# Patient Record
Sex: Female | Born: 1945
Health system: Southern US, Community
[De-identification: ages and names within clinical notes are randomized; demographics above are authoritative.]

## PROBLEM LIST (undated history)

## (undated) DIAGNOSIS — F419 Anxiety disorder, unspecified: Secondary | ICD-10-CM

## (undated) DIAGNOSIS — C801 Malignant (primary) neoplasm, unspecified: Secondary | ICD-10-CM

## (undated) DIAGNOSIS — T8859XA Other complications of anesthesia, initial encounter: Secondary | ICD-10-CM

## (undated) DIAGNOSIS — T4145XA Adverse effect of unspecified anesthetic, initial encounter: Secondary | ICD-10-CM

## (undated) DIAGNOSIS — O24419 Gestational diabetes mellitus in pregnancy, unspecified control: Secondary | ICD-10-CM

## (undated) HISTORY — DX: Anxiety disorder, unspecified: F41.9

## (undated) HISTORY — PX: TUBAL LIGATION: SHX77

## (undated) HISTORY — PX: DILATION AND CURETTAGE OF UTERUS: SHX78

---
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
## 2009-10-28 ENCOUNTER — Ambulatory Visit: Payer: Self-pay | Admitting: Internal Medicine

## 2009-11-09 ENCOUNTER — Ambulatory Visit: Payer: Self-pay

## 2009-11-10 ENCOUNTER — Ambulatory Visit: Payer: Self-pay | Admitting: Otolaryngology

## 2009-11-17 ENCOUNTER — Ambulatory Visit: Payer: Self-pay | Admitting: Cardiovascular Disease

## 2010-12-20 LAB — HM PAP SMEAR: HM Pap smear: NORMAL

## 2013-01-22 LAB — CBC AND DIFFERENTIAL: HEMOGLOBIN: 14.1 g/dL (ref 12.0–16.0)

## 2013-01-22 LAB — BASIC METABOLIC PANEL
BUN: 6 mg/dL (ref 4–21)
Creatinine: 0.7 mg/dL (ref 0.5–1.1)

## 2013-01-22 LAB — LIPID PANEL
Cholesterol: 185 mg/dL (ref 0–200)
HDL: 63 mg/dL (ref 35–70)
LDL CALC: 105 mg/dL
TRIGLYCERIDES: 86 mg/dL (ref 40–160)

## 2013-01-22 LAB — TSH: TSH: 1.6 u[IU]/mL (ref 0.41–5.90)

## 2013-01-24 ENCOUNTER — Ambulatory Visit: Payer: Self-pay | Admitting: Internal Medicine

## 2013-01-24 LAB — HM MAMMOGRAPHY: HM Mammogram: NORMAL

## 2014-12-15 DIAGNOSIS — Z85828 Personal history of other malignant neoplasm of skin: Secondary | ICD-10-CM | POA: Insufficient documentation

## 2015-03-10 ENCOUNTER — Encounter: Payer: Self-pay | Admitting: Internal Medicine

## 2015-03-10 DIAGNOSIS — E559 Vitamin D deficiency, unspecified: Secondary | ICD-10-CM | POA: Insufficient documentation

## 2015-03-10 DIAGNOSIS — N951 Menopausal and female climacteric states: Secondary | ICD-10-CM | POA: Insufficient documentation

## 2015-03-10 DIAGNOSIS — K589 Irritable bowel syndrome without diarrhea: Secondary | ICD-10-CM | POA: Insufficient documentation

## 2015-04-06 ENCOUNTER — Encounter: Payer: Self-pay | Admitting: Internal Medicine

## 2015-04-06 ENCOUNTER — Ambulatory Visit (INDEPENDENT_AMBULATORY_CARE_PROVIDER_SITE_OTHER): Payer: Medicare Other | Admitting: Internal Medicine

## 2015-04-06 VITALS — BP 112/70 | HR 68 | Ht 73.5 in | Wt 143.0 lb

## 2015-04-06 DIAGNOSIS — Z0000 Encounter for general adult medical examination without abnormal findings: Secondary | ICD-10-CM | POA: Diagnosis not present

## 2015-04-06 DIAGNOSIS — E2839 Other primary ovarian failure: Secondary | ICD-10-CM | POA: Diagnosis not present

## 2015-04-06 DIAGNOSIS — Z1159 Encounter for screening for other viral diseases: Secondary | ICD-10-CM | POA: Diagnosis not present

## 2015-04-06 DIAGNOSIS — E559 Vitamin D deficiency, unspecified: Secondary | ICD-10-CM

## 2015-04-06 DIAGNOSIS — K589 Irritable bowel syndrome without diarrhea: Secondary | ICD-10-CM

## 2015-04-06 DIAGNOSIS — Z1239 Encounter for other screening for malignant neoplasm of breast: Secondary | ICD-10-CM

## 2015-04-06 NOTE — Patient Instructions (Addendum)
Health Maintenance  Topic Date Due  . Hepatitis C Screening  Ordered today  . TETANUS/TDAP  03/07/1965  . COLONOSCOPY  Under consideration  . ZOSTAVAX  03/07/2006  . DEXA SCAN  Planned  . PNA vac Low Risk Adult (1 of 2 - PCV13) 03/08/2011  . MAMMOGRAM  Planned  . INFLUENZA VACCINE  10/19/2015 (Originally 10/19/2014)   Breast Self-Awareness Practicing breast self-awareness may pick up problems early, prevent significant medical complications, and possibly save your life. By practicing breast self-awareness, you can become familiar with how your breasts look and feel and if your breasts are changing. This allows you to notice changes early. It can also offer you some reassurance that your breast health is good. One way to learn what is normal for your breasts and whether your breasts are changing is to do a breast self-exam. If you find a lump or something that was not present in the past, it is best to contact your caregiver right away. Other findings that should be evaluated by your caregiver include nipple discharge, especially if it is bloody; skin changes or reddening; areas where the skin seems to be pulled in (retracted); or new lumps and bumps. Breast pain is seldom associated with cancer (malignancy), but should also be evaluated by a caregiver. HOW TO PERFORM A BREAST SELF-EXAM The best time to examine your breasts is 5-7 days after your menstrual period is over. During menstruation, the breasts are lumpier, and it may be more difficult to pick up changes. If you do not menstruate, have reached menopause, or had your uterus removed (hysterectomy), you should examine your breasts at regular intervals, such as monthly. If you are breastfeeding, examine your breasts after a feeding or after using a breast pump. Breast implants do not decrease the risk for lumps or tumors, so continue to perform breast self-exams as recommended. Talk to your caregiver about how to determine the difference between  the implant and breast tissue. Also, talk about the amount of pressure you should use during the exam. Over time, you will become more familiar with the variations of your breasts and more comfortable with the exam. A breast self-exam requires you to remove all your clothes above the waist. 1. Look at your breasts and nipples. Stand in front of a mirror in a room with good lighting. With your hands on your hips, push your hands firmly downward. Look for a difference in shape, contour, and size from one breast to the other (asymmetry). Asymmetry includes puckers, dips, or bumps. Also, look for skin changes, such as reddened or scaly areas on the breasts. Look for nipple changes, such as discharge, dimpling, repositioning, or redness. 2. Carefully feel your breasts. This is best done either in the shower or tub while using soapy water or when flat on your back. Place the arm (on the side of the breast you are examining) above your head. Use the pads (not the fingertips) of your three middle fingers on your opposite hand to feel your breasts. Start in the underarm area and use  inch (2 cm) overlapping circles to feel your breast. Use 3 different levels of pressure (light, medium, and firm pressure) at each circle before moving to the next circle. The light pressure is needed to feel the tissue closest to the skin. The medium pressure will help to feel breast tissue a little deeper, while the firm pressure is needed to feel the tissue close to the ribs. Continue the overlapping circles, moving downward over  the breast until you feel your ribs below your breast. Then, move one finger-width towards the center of the body. Continue to use the  inch (2 cm) overlapping circles to feel your breast as you move slowly up toward the collar bone (clavicle) near the base of the neck. Continue the up and down exam using all 3 pressures until you reach the middle of the chest. Do this with each breast, carefully feeling for  lumps or changes. 3.  Keep a written record with breast changes or normal findings for each breast. By writing this information down, you do not need to depend only on memory for size, tenderness, or location. Write down where you are in your menstrual cycle, if you are still menstruating. Breast tissue can have some lumps or thick tissue. However, see your caregiver if you find anything that concerns you.  SEEK MEDICAL CARE IF:  You see a change in shape, contour, or size of your breasts or nipples.   You see skin changes, such as reddened or scaly areas on the breasts or nipples.   You have an unusual discharge from your nipples.   You feel a new lump or unusually thick areas.    This information is not intended to replace advice given to you by your health care provider. Make sure you discuss any questions you have with your health care provider.   Document Released: 03/06/2005 Document Revised: 02/21/2012 Document Reviewed: 06/21/2011 Elsevier Interactive Patient Education [employer].

## 2015-04-06 NOTE — Progress Notes (Signed)
Patient: Jessica Monroe, Female    DOB: April 27, 1945, 70 y.o.   MRN: [id number] Visit Date: 04/06/2015  Today's Provider: Halina Maidens, MD   Chief Complaint  Patient presents with  . Medicare Wellness   Subjective:    Annual wellness visit Jessica Monroe is a 70 y.o. female who presents today for her Subsequent Annual Wellness Visit. She feels well. She reports exercising walking 2 hours daily. She reports she is sleeping well. 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  She has had several falls - none recent - and would consider osteoporosis treatment if needed.  Her last mammogram was several years ago - she denies breast problems. She has never had a colonoscopy - would consider it if stool were positive for occult blood.  ----------------------------------------------------------- HPI  Review of Systems  Constitutional: Negative for fever, chills, diaphoresis and fatigue.  HENT: Negative for hearing loss, sinus pressure, tinnitus, trouble swallowing and voice change.   Eyes: Negative for visual disturbance.  Respiratory: Negative for cough, chest tightness and shortness of breath.   Cardiovascular: Negative for chest pain, palpitations and leg swelling.  Gastrointestinal: Negative for abdominal pain and blood in stool.  Genitourinary: Negative for dysuria, hematuria, vaginal bleeding and vaginal discharge.  Musculoskeletal: Negative for back pain and arthralgias.  Skin: Negative for color change.  Allergic/Immunologic: Negative for food allergies.  Neurological: Negative for dizziness, syncope, numbness and headaches.  Hematological: Negative for adenopathy. Does not bruise/bleed easily.  Psychiatric/Behavioral: Negative for sleep disturbance and dysphoric mood.    Social History   Social History  . Marital Status: Single    Spouse Name: N/A  . Number of Children: N/A  . Years of Education:  N/A   Occupational History  . Not on file.   Social History Main Topics  . Smoking status: Former Research scientist (life sciences)  . Smokeless tobacco: Not on file  . Alcohol Use: No  . Drug Use: No  . Sexual Activity: Not on file   Other Topics Concern  . Not on file   Social History Narrative    Patient Active Problem List   Diagnosis Date Noted  . IBS (irritable bowel syndrome) 03/10/2015  . Vitamin D deficiency 03/10/2015  . Menopause syndrome 03/10/2015  . H/O neoplasm 12/15/2014    Past Surgical History  Procedure Laterality Date  . Tubal ligation    . Dilation and curettage of uterus      Her family history includes Bipolar disorder in her father; Ovarian cancer in her mother.    Previous Medications   DOCOSAHEXAENOIC ACID (DHA) 200 MG CAPS    Take by mouth.   MULTIPLE VITAMIN (MULTI-VITAMINS) TABS    Take 1 tablet by mouth daily.    Patient Care Team: Glean Hess, MD as PCP - General (Family Medicine)     Objective:   Vitals: BP 112/70 mmHg  Pulse 68  Ht 6' 1.5" (1.867 m)  Wt 143 lb (64.864 kg)  BMI 18.61 kg/m2  Physical Exam  Constitutional: She is oriented to person, place, and time. She appears well-developed and well-nourished. No distress.  HENT:  Head: Normocephalic and atraumatic.  Right Ear: Tympanic membrane and ear canal normal.  Left Ear: Tympanic membrane and ear canal normal.  Nose: Right sinus exhibits no maxillary sinus tenderness. Left sinus exhibits no maxillary sinus tenderness.  Mouth/Throat: Uvula is midline and oropharynx is clear and moist.  Eyes: Conjunctivae  and EOM are normal. Right eye exhibits no discharge. Left eye exhibits no discharge. No scleral icterus.  Neck: Normal range of motion. Carotid bruit is not present. No erythema present. No thyromegaly present.  Cardiovascular: Normal rate, regular rhythm, normal heart sounds and normal pulses.   Pulmonary/Chest: Effort normal. No respiratory distress. She has no wheezes. Right breast  exhibits no mass, no nipple discharge, no skin change and no tenderness. Left breast exhibits no mass, no nipple discharge, no skin change and no tenderness.  Abdominal: Soft. Bowel sounds are normal. There is no hepatosplenomegaly. There is no tenderness. There is no CVA tenderness.  Genitourinary: Uterus normal. There is no rash or tenderness on the right labia. There is no rash or tenderness on the left labia. Cervix exhibits no motion tenderness. Right adnexum displays no mass, no tenderness and no fullness. Left adnexum displays no mass, no tenderness and no fullness.  Musculoskeletal: Normal range of motion.  Lymphadenopathy:    She has no cervical adenopathy.    She has no axillary adenopathy.  Neurological: She is alert and oriented to person, place, and time. She has normal reflexes. No cranial nerve deficit or sensory deficit.  Skin: Skin is warm, dry and intact. No rash noted.  Psychiatric: She has a normal mood and affect. Her speech is normal and behavior is normal. Thought content normal.  Nursing note and vitals reviewed.   Activities of Daily Living In your present state of health, do you have any difficulty performing the following activities: 04/06/2015  Hearing? N  Vision? N  Difficulty concentrating or making decisions? N  Walking or climbing stairs? N  Dressing or bathing? N  Doing errands, shopping? N    Fall Risk Assessment Fall Risk  04/06/2015  Falls in the past year? No      Depression Screen PHQ 2/9 Scores 04/06/2015  PHQ - 2 Score 0    Cognitive Testing - 6-CIT   Correct? Score   What year is it? yes 0 Yes = 0    No = 4  What month is it? yes 0 Yes = 0    No = 3  Remember:     [address]ia [address], [place], [place]     What time is it? yes 0 Yes = 0    No = 3  Count backwards from 20 to 1 yes 0 Correct = 0    1 error = 2   More than 1 error = 4  Say the months of the year in reverse. yes 0 Correct = 0    1 error = 2   More than 1 error = 4  What  address did I ask you to remember? yes 0 Correct = 0  1 error = 2    2 error = 4    3 error = 6    4 error = 8    All wrong = 10       TOTAL SCORE  0/28   Interpretation:  Normal  Normal (0-7) Abnormal (8-28)        Medicare Annual Wellness Visit Summary:  Reviewed patient's Family Medical History Reviewed and updated list of patient's medical providers Assessment of cognitive impairment was done Assessed patient's functional ability Established a written schedule for health screening [place] Completed and Reviewed  Exercise Activities and Dietary recommendations Goals    None       There is no immunization history on file for  this patient.  Health Maintenance  Topic Date Due  . Hepatitis C Screening  1945-04-28  . TETANUS/TDAP  03/07/1965  . COLONOSCOPY  03/07/1996  . ZOSTAVAX  03/07/2006  . DEXA SCAN  03/08/2011  . PNA vac Low Risk Adult (1 of 2 - PCV13) 03/08/2011  . MAMMOGRAM  01/25/2015  . INFLUENZA VACCINE  10/19/2015 (Originally 10/19/2014)     Discussed health benefits of physical activity, and encouraged her to engage in regular exercise appropriate for her age and condition.    ------------------------------------------------------------------------------------------------------------   Assessment & Plan:   1. Medicare annual wellness visit, subsequent Patient declines all vaccinations - POCT urinalysis dipstick  2. IBS (irritable bowel syndrome) Stable symptoms managed well with vegetarian diet and high-fiber - CBC with Differential/Platelet - Comprehensive metabolic panel - TSH - Fecal occult blood, imunochemical  3. Vitamin D deficiency Recommend calcium plus vitamin D 2 tablets daily  4. Need for hepatitis C screening test - Hepatitis C antibody  5. Breast cancer screening - MM DIGITAL SCREENING BILATERAL; Future  6. Ovarian failure - DG Bone Density; Future   Halina Maidens, MD [facility]  04/06/2015

## 2015-04-07 LAB — COMPREHENSIVE METABOLIC PANEL
A/G RATIO: 2 (ref 1.1–2.5)
ALBUMIN: 4.5 g/dL (ref 3.6–4.8)
ALT: 10 IU/L (ref 0–32)
AST: 44 IU/L — ABNORMAL HIGH (ref 0–40)
Alkaline Phosphatase: 70 IU/L (ref 39–117)
BILIRUBIN TOTAL: 0.3 mg/dL (ref 0.0–1.2)
BUN / CREAT RATIO: 11 (ref 11–26)
BUN: 9 mg/dL (ref 8–27)
CALCIUM: 9.7 mg/dL (ref 8.7–10.3)
CHLORIDE: 99 mmol/L (ref 96–106)
CO2: 28 mmol/L (ref 18–29)
Creatinine, Ser: 0.83 mg/dL (ref 0.57–1.00)
GFR, EST AFRICAN AMERICAN: 83 mL/min/{1.73_m2} (ref >59)
GFR, EST NON AFRICAN AMERICAN: 72 mL/min/{1.73_m2} (ref >59)
GLOBULIN, TOTAL: 2.3 g/dL (ref 1.5–4.5)
Glucose: 95 mg/dL (ref 65–99)
POTASSIUM: 4.7 mmol/L (ref 3.5–5.2)
SODIUM: 142 mmol/L (ref 134–144)
TOTAL PROTEIN: 6.8 g/dL (ref 6.0–8.5)

## 2015-04-07 LAB — HEPATITIS C ANTIBODY: Hep C Virus Ab: <0.1 s/co ratio (ref 0.0–0.9)

## 2015-04-07 LAB — CBC WITH DIFFERENTIAL/PLATELET
BASOS ABS: 0 10*3/uL (ref 0.0–0.2)
Basos: 1 %
EOS (ABSOLUTE): 0.1 10*3/uL (ref 0.0–0.4)
Eos: 2 %
HEMOGLOBIN: 13.9 g/dL (ref 11.1–15.9)
Hematocrit: 42.2 % (ref 34.0–46.6)
IMMATURE GRANS (ABS): 0 10*3/uL (ref 0.0–0.1)
IMMATURE GRANULOCYTES: 0 %
Lymphocytes Absolute: 1.4 10*3/uL (ref 0.7–3.1)
Lymphs: 26 %
MCH: 30.4 pg (ref 26.6–33.0)
MCHC: 32.9 g/dL (ref 31.5–35.7)
MCV: 92 fL (ref 79–97)
MONOCYTES: 8 %
Monocytes Absolute: 0.5 10*3/uL (ref 0.1–0.9)
Neutrophils Absolute: 3.4 10*3/uL (ref 1.4–7.0)
Neutrophils: 63 %
PLATELETS: 250 10*3/uL (ref 150–379)
RBC: 4.57 x10E6/uL (ref 3.77–5.28)
RDW: 13.7 % (ref 12.3–15.4)
WBC: 5.4 10*3/uL (ref 3.4–10.8)

## 2015-04-07 LAB — TSH: TSH: 2.88 u[IU]/mL (ref 0.450–4.500)

## 2015-04-08 ENCOUNTER — Telehealth: Payer: Self-pay

## 2015-04-08 NOTE — Telephone Encounter (Signed)
Spoke with patient. Patient advised of all results and verbalized understanding. Will call back with any future questions or concerns. MAH  

## 2015-04-08 NOTE — Telephone Encounter (Signed)
-----   Message from Glean Hess, MD sent at 04/07/2015  1:35 PM EST ----- Labs are all normal.

## 2015-04-13 ENCOUNTER — Telehealth: Payer: Self-pay

## 2015-04-13 ENCOUNTER — Ambulatory Visit
Admission: RE | Admit: 2015-04-13 | Discharge: 2015-04-13 | Disposition: A | Discharge status: Home / Self Care | Payer: Medicare Other | Source: Ambulatory Visit | Attending: Internal Medicine | Admitting: Internal Medicine

## 2015-04-13 ENCOUNTER — Encounter: Payer: Self-pay | Admitting: Internal Medicine

## 2015-04-13 DIAGNOSIS — E2839 Other primary ovarian failure: Secondary | ICD-10-CM | POA: Diagnosis present

## 2015-04-13 DIAGNOSIS — Z1239 Encounter for other screening for malignant neoplasm of breast: Secondary | ICD-10-CM | POA: Diagnosis present

## 2015-04-13 DIAGNOSIS — M810 Age-related osteoporosis without current pathological fracture: Secondary | ICD-10-CM | POA: Insufficient documentation

## 2015-04-13 DIAGNOSIS — Z1231 Encounter for screening mammogram for malignant neoplasm of breast: Secondary | ICD-10-CM | POA: Diagnosis not present

## 2015-04-13 HISTORY — DX: Malignant (primary) neoplasm, unspecified: C80.1

## 2015-04-13 NOTE — Telephone Encounter (Signed)
Spoke with pt. Pt. Advised of all results. Patient verbalized understanding.

## 2015-04-13 NOTE — Telephone Encounter (Signed)
-----   Message from Glean Hess, MD sent at 04/13/2015  1:04 PM EST ----- Bone density shows osteoporosis at the hips and significant bone loss (osteopenia) at the spine.  Make sure to take calcium 1600 mg and Vitamin D 800 IU per day.  Should also consider treatment with Fosamax 70 mg weekly and repeat test in 2 years.

## 2015-04-24 LAB — FECAL OCCULT BLOOD, IMMUNOCHEMICAL: FECAL OCCULT BLD: NEGATIVE

## 2015-04-26 ENCOUNTER — Telehealth: Payer: Self-pay

## 2015-04-26 NOTE — Telephone Encounter (Signed)
Spoke with patient. Patient advised of all results and verbalized understanding. Will call back with any future questions or concerns. MAH  

## 2015-04-26 NOTE — Telephone Encounter (Signed)
-----   Message from Glean Hess, MD sent at 04/24/2015  3:48 PM EST ----- Stool test is negative for blood.

## 2016-08-01 ENCOUNTER — Encounter: Payer: Self-pay | Admitting: Internal Medicine

## 2016-08-02 ENCOUNTER — Encounter: Payer: Self-pay | Admitting: Internal Medicine

## 2016-08-02 ENCOUNTER — Ambulatory Visit (INDEPENDENT_AMBULATORY_CARE_PROVIDER_SITE_OTHER): Payer: Medicare Other | Admitting: Internal Medicine

## 2016-08-02 VITALS — BP 128/78 | HR 80 | Ht 72.0 in | Wt 134.0 lb

## 2016-08-02 DIAGNOSIS — Z85828 Personal history of other malignant neoplasm of skin: Secondary | ICD-10-CM

## 2016-08-02 DIAGNOSIS — K589 Irritable bowel syndrome without diarrhea: Secondary | ICD-10-CM

## 2016-08-02 DIAGNOSIS — Z0000 Encounter for general adult medical examination without abnormal findings: Secondary | ICD-10-CM

## 2016-08-02 DIAGNOSIS — D485 Neoplasm of uncertain behavior of skin: Secondary | ICD-10-CM

## 2016-08-02 DIAGNOSIS — Z1211 Encounter for screening for malignant neoplasm of colon: Secondary | ICD-10-CM

## 2016-08-02 DIAGNOSIS — E559 Vitamin D deficiency, unspecified: Secondary | ICD-10-CM | POA: Diagnosis not present

## 2016-08-02 LAB — POCT URINALYSIS DIPSTICK
Bilirubin, UA: NEGATIVE
GLUCOSE UA: NEGATIVE
Ketones, UA: NEGATIVE
Leukocytes, UA: NEGATIVE
Nitrite, UA: NEGATIVE
PH UA: 6.5 (ref 5.0–8.0)
Protein, UA: NEGATIVE
RBC UA: NEGATIVE
SPEC GRAV UA: 1.015 (ref 1.010–1.025)
UROBILINOGEN UA: 0.2 U/dL

## 2016-08-02 NOTE — Patient Instructions (Signed)
Health Maintenance  Topic Date Due  . TETANUS/TDAP  03/07/1965  . COLONOSCOPY  03/07/1996  . PNA vac Low Risk Adult (1 of 2 - PCV13) 03/08/2011  . INFLUENZA VACCINE  10/18/2016  . MAMMOGRAM  04/13/2017  . DEXA SCAN  Completed  . Hepatitis C Screening  Completed    Breast Self-Awareness Breast self-awareness means being familiar with how your breasts look and feel. It involves checking your breasts regularly and reporting any changes to your health care provider. Practicing breast self-awareness is important. A change in your breasts can be a sign of a serious medical problem. Being familiar with how your breasts look and feel allows you to find any problems early, when treatment is more likely to be successful. All women should practice breast self-awareness, including women who have had breast implants. How to do a breast self-exam One way to learn what is normal for your breasts and whether your breasts are changing is to do a breast self-exam. To do a breast self-exam: Look for Changes   1. Remove all the clothing above your waist. 2. Stand in front of a mirror in a room with good lighting. 3. Put your hands on your hips. 4. Push your hands firmly downward. 5. Compare your breasts in the mirror. Look for differences between them (asymmetry), such as:  Differences in shape.  Differences in size.  Puckers, dips, and bumps in one breast and not the other. 6. Look at each breast for changes in your skin, such as:  Redness.  Scaly areas. 7. Look for changes in your nipples, such as:  Discharge.  Bleeding.  Dimpling.  Redness.  A change in position. Feel for Changes   Carefully feel your breasts for lumps and changes. It is best to do this while lying on your back on the floor and again while sitting or standing in the shower or tub with soapy water on your skin. Feel each breast in the following way:  Place the arm on the side of the breast you are examining above your  head.  Feel your breast with the other hand.  Start in the nipple area and make  inch (2 cm) overlapping circles to feel your breast. Use the pads of your three middle fingers to do this. Apply light pressure, then medium pressure, then firm pressure. The light pressure will allow you to feel the tissue closest to the skin. The medium pressure will allow you to feel the tissue that is a little deeper. The firm pressure will allow you to feel the tissue close to the ribs.  Continue the overlapping circles, moving downward over the breast until you feel your ribs below your breast.  Move one finger-width toward the center of the body. Continue to use the  inch (2 cm) overlapping circles to feel your breast as you move slowly up toward your collarbone.  Continue the up and down exam using all three pressures until you reach your armpit. Write Down What You Find   Write down what is normal for each breast and any changes that you find. Keep a written record with breast changes or normal findings for each breast. By writing this information down, you do not need to depend only on memory for size, tenderness, or location. Write down where you are in your menstrual cycle, if you are still menstruating. If you are having trouble noticing differences in your breasts, do not get discouraged. With time you will become more familiar with the  variations in your breasts and more comfortable with the exam. How often should I examine my breasts? Examine your breasts every month. If you are breastfeeding, the best time to examine your breasts is after a feeding or after using a breast pump. If you menstruate, the best time to examine your breasts is 5-7 days after your period is over. During your period, your breasts are lumpier, and it may be more difficult to notice changes. When should I see my health care provider? See your health care provider if you notice:  A change in shape or size of your breasts or  nipples.  A change in the skin of your breast or nipples, such as a reddened or scaly area.  Unusual discharge from your nipples.  A lump or thick area that was not there before.  Pain in your breasts.  Anything that concerns you. This information is not intended to replace advice given to you by your health care provider. Make sure you discuss any questions you have with your health care provider. Document Released: 03/06/2005 Document Revised: 08/12/2015 Document Reviewed: 01/24/2015 Elsevier Interactive Patient Education  2017 [employer].

## 2016-08-02 NOTE — Progress Notes (Signed)
Patient: Jessica Monroe, Female    DOB: 10-22-1945, 71 y.o.   MRN: [id number] Visit Date: 08/02/2016  Today's Provider: Halina Maidens, MD   Chief Complaint  Patient presents with  . Medicare Wellness    Pap and Breast.   Subjective:    Annual wellness visit Jessica Monroe is a 71 y.o. female who presents today for her Subsequent Annual Wellness Visit. She feels well. She reports exercising walking. She reports she is sleeping fairly well. She is now vegetarian and has lost 9 lbs.  ----------------------------------------------------------- HPI  Review of Systems  Constitutional: Positive for unexpected weight change (likely related to dental issues and vegetarian diet). Negative for chills, fatigue and fever.  HENT: Negative for congestion, hearing loss, tinnitus, trouble swallowing and voice change.   Eyes: Negative for visual disturbance.  Respiratory: Negative for cough, chest tightness, shortness of breath and wheezing.   Cardiovascular: Negative for chest pain, palpitations and leg swelling.  Gastrointestinal: Positive for abdominal distention (lump in lower left abdomen). Negative for abdominal pain, constipation, diarrhea and vomiting.  Endocrine: Negative for polydipsia and polyuria.  Genitourinary: Negative for dysuria, frequency, genital sores, vaginal bleeding and vaginal discharge.  Musculoskeletal: Positive for back pain. Negative for arthralgias, gait problem and joint swelling.  Skin: Positive for color change (mole on labia; wart on toe). Negative for rash.  Neurological: Negative for dizziness, tremors, light-headedness and headaches.  Hematological: Negative for adenopathy. Does not bruise/bleed easily.  Psychiatric/Behavioral: Negative for dysphoric mood and sleep disturbance. The patient is not nervous/anxious.     Social History   Social History  . Marital status: Single    Spouse name: N/A  . Number of children: N/A  . Years of education: N/A    Occupational History  . Not on file.   Social History Main Topics  . Smoking status: Former Research scientist (life sciences)  . Smokeless tobacco: Never Used  . Alcohol use No  . Drug use: No  . Sexual activity: Not on file   Other Topics Concern  . Not on file   Social History Narrative  . No narrative on file    Patient Active Problem List   Diagnosis Date Noted  . Osteoporosis 04/13/2015  . IBS (irritable bowel syndrome) 03/10/2015  . Vitamin D deficiency 03/10/2015  . Menopause syndrome 03/10/2015  . Hx of nonmelanoma skin cancer 12/15/2014    Past Surgical History:  Procedure Laterality Date  . DILATION AND CURETTAGE OF UTERUS    . TUBAL LIGATION      Her family history includes Bipolar disorder in her father; Ovarian cancer in her mother.     Previous Medications   CALCIUM-VITAMIN D (OSCAL WITH D) 500-200 MG-UNIT TABLET    Take 1 tablet by mouth.   DOCOSAHEXAENOIC ACID (DHA) 200 MG CAPS    Take by mouth.   MULTIPLE VITAMIN (MULTI-VITAMINS) TABS    Take 1 tablet by mouth daily.    Patient Care Team: Glean Hess, MD as PCP - General (Family Medicine)      Objective:   Vitals: BP 128/78 (BP Location: Right Arm, Patient Position: Sitting, Cuff Size: Normal)   Pulse 80   Ht 6' (1.829 m)   Wt 134 lb (60.8 kg)   SpO2 99%   BMI 18.17 kg/m   Physical Exam  Constitutional: She is oriented to person, place, and time. She appears well-developed and well-nourished. No distress.  HENT:  Head: Normocephalic and atraumatic.  Right Ear: Tympanic membrane and ear canal normal.  Left Ear: Tympanic membrane and ear canal normal.  Nose: Right sinus exhibits no maxillary sinus tenderness. Left sinus exhibits no maxillary sinus tenderness.  Mouth/Throat: Uvula is midline and oropharynx is clear and moist.  Eyes: Conjunctivae and EOM are normal. Right eye exhibits no discharge. Left eye exhibits no discharge. No scleral icterus.  Neck: Normal range of motion. Carotid bruit is not  present. No erythema present. No thyromegaly present.  Cardiovascular: Normal rate, regular rhythm, normal heart sounds and normal pulses.   Pulmonary/Chest: Effort normal. No respiratory distress. She has no wheezes. Right breast exhibits no mass, no nipple discharge, no skin change and no tenderness. Left breast exhibits no mass, no nipple discharge, no skin change and no tenderness.  Abdominal: Soft. Bowel sounds are normal. There is no hepatosplenomegaly. There is no tenderness. There is no CVA tenderness.    Genitourinary: Uterus normal.    There is no tenderness, lesion or injury on the right labia. There is lesion on the left labia. There is no tenderness or injury on the left labia. Cervix exhibits no motion tenderness. Right adnexum displays no mass, no tenderness and no fullness. Left adnexum displays no mass, no tenderness and no fullness. No vaginal discharge found.  Musculoskeletal: Normal range of motion.  Lymphadenopathy:    She has no cervical adenopathy.    She has no axillary adenopathy.  Neurological: She is alert and oriented to person, place, and time. She has normal reflexes. No cranial nerve deficit or sensory deficit.  Skin: Skin is warm, dry and intact. No rash noted.  Psychiatric: She has a normal mood and affect. Her speech is normal and behavior is normal. Thought content normal.  Nursing note and vitals reviewed.   Activities of Daily Living In your present state of health, do you have any difficulty performing the following activities: 08/02/2016  Hearing? N  Vision? N  Difficulty concentrating or making decisions? N  Walking or climbing stairs? N  Dressing or bathing? N  Doing errands, shopping? N  Preparing Food and eating ? N  Using the Toilet? N  In the past six months, have you accidently leaked urine? N  Do you have problems with loss of bowel control? N  Managing your Medications? N  Housekeeping or managing your Housekeeping? N  Some recent data  might be hidden   Fall Risk Assessment Fall Risk  08/02/2016 04/06/2015  Falls in the past year? No No    Depression Screen PHQ 2/9 Scores 08/02/2016 04/06/2015  PHQ - 2 Score 0 0   6CIT Screen 08/02/2016  What Year? 0 points  What month? 0 points  What time? 0 points  Count back from 20 0 points  Months in reverse 2 points  Repeat phrase 0 points  Total Score 2    Medicare Annual Wellness Visit Summary:  Reviewed patient's Family Medical History Reviewed and updated list of patient's medical providers Assessment of cognitive impairment was done Assessed patient's functional ability Established a written schedule for health screening [place] Completed and Reviewed  Exercise Activities and Dietary recommendations Goals    None       There is no immunization history on file for this patient.  Health Maintenance  Topic Date Due  . TETANUS/TDAP  03/07/1965  . COLONOSCOPY  03/07/1996  . PNA vac Low Risk Adult (1 of 2 - PCV13) 03/08/2011  . INFLUENZA VACCINE  10/18/2016  . MAMMOGRAM  04/13/2017  . DEXA SCAN  Completed  .  Hepatitis C Screening  Completed    Discussed health benefits of physical activity, and encouraged her to engage in regular exercise appropriate for her age and condition.    ------------------------------------------------------------------------------------------------------------  Assessment & Plan:  1. Medicare annual wellness visit, subsequent Measures satisfied She declines mammograms, vaccinations and colonoscopy Monitor weight for additional losses Increase intake of protein and healthy fats - Lipid panel - POCT urinalysis dipstick  2. Irritable bowel syndrome, unspecified type stable - CBC with Differential/Platelet - Comprehensive metabolic panel - TSH  3. Vitamin D deficiency Continue supplements  4. Hx of nonmelanoma skin cancer Follow up with Dermatology if labial lesion changes and for warty lesion on  toe  5. Colon cancer screening - Fecal occult blood, imunochemical  6. Neoplasm of uncertain behavior of skin See Dermatology as above   No orders of the defined types were placed in this encounter.   Halina Maidens, MD [facility]  08/02/2016

## 2016-08-03 LAB — CBC WITH DIFFERENTIAL/PLATELET
BASOS: 1 %
Basophils Absolute: 0 10*3/uL (ref 0.0–0.2)
EOS (ABSOLUTE): 0.1 10*3/uL (ref 0.0–0.4)
Eos: 3 %
HEMATOCRIT: 42.4 % (ref 34.0–46.6)
Hemoglobin: 14.1 g/dL (ref 11.1–15.9)
Immature Grans (Abs): 0 10*3/uL (ref 0.0–0.1)
Immature Granulocytes: 0 %
LYMPHS ABS: 1.2 10*3/uL (ref 0.7–3.1)
Lymphs: 26 %
MCH: 30.3 pg (ref 26.6–33.0)
MCHC: 33.3 g/dL (ref 31.5–35.7)
MCV: 91 fL (ref 79–97)
MONOS ABS: 0.4 10*3/uL (ref 0.1–0.9)
Monocytes: 9 %
NEUTROS ABS: 2.9 10*3/uL (ref 1.4–7.0)
NEUTROS PCT: 61 %
PLATELETS: 264 10*3/uL (ref 150–379)
RBC: 4.66 x10E6/uL (ref 3.77–5.28)
RDW: 13.8 % (ref 12.3–15.4)
WBC: 4.7 10*3/uL (ref 3.4–10.8)

## 2016-08-03 LAB — LIPID PANEL
Chol/HDL Ratio: 2.5 ratio (ref 0.0–4.4)
Cholesterol, Total: 172 mg/dL (ref 100–199)
HDL: 68 mg/dL (ref >39)
LDL Calculated: 87 mg/dL (ref 0–99)
TRIGLYCERIDES: 84 mg/dL (ref 0–149)
VLDL Cholesterol Cal: 17 mg/dL (ref 5–40)

## 2016-08-03 LAB — COMPREHENSIVE METABOLIC PANEL
A/G RATIO: 2 (ref 1.2–2.2)
ALT: 13 IU/L (ref 0–32)
AST: 50 IU/L — AB (ref 0–40)
Albumin: 4.7 g/dL (ref 3.5–4.8)
Alkaline Phosphatase: 79 IU/L (ref 39–117)
BILIRUBIN TOTAL: 0.4 mg/dL (ref 0.0–1.2)
BUN/Creatinine Ratio: 9 — ABNORMAL LOW (ref 12–28)
BUN: 7 mg/dL — ABNORMAL LOW (ref 8–27)
CHLORIDE: 101 mmol/L (ref 96–106)
CO2: 28 mmol/L (ref 18–29)
Calcium: 9.7 mg/dL (ref 8.7–10.3)
Creatinine, Ser: 0.79 mg/dL (ref 0.57–1.00)
GFR calc Af Amer: 88 mL/min/{1.73_m2} (ref >59)
GFR calc non Af Amer: 76 mL/min/{1.73_m2} (ref >59)
GLOBULIN, TOTAL: 2.4 g/dL (ref 1.5–4.5)
Glucose: 90 mg/dL (ref 65–99)
POTASSIUM: 4.3 mmol/L (ref 3.5–5.2)
SODIUM: 142 mmol/L (ref 134–144)
Total Protein: 7.1 g/dL (ref 6.0–8.5)

## 2016-08-03 LAB — TSH: TSH: 3.93 u[IU]/mL (ref 0.450–4.500)

## 2016-08-17 LAB — FECAL OCCULT BLOOD, IMMUNOCHEMICAL: FECAL OCCULT BLD: NEGATIVE

## 2017-07-20 ENCOUNTER — Telehealth: Payer: Self-pay | Admitting: Internal Medicine

## 2017-07-20 NOTE — Telephone Encounter (Signed)
Called to schedule Medicare Annual Wellness Visit with Nurse Health Advisor. If patient returns call please note: their last AWV was on 08/02/16 please schedule AWV with NHA any date after Aug 02 2017  Thank you! For any questions please contact: Jill Alexanders [phone]  Skype [email or web address]

## 2017-08-03 ENCOUNTER — Ambulatory Visit (INDEPENDENT_AMBULATORY_CARE_PROVIDER_SITE_OTHER): Payer: Medicare Other | Admitting: Internal Medicine

## 2017-08-03 ENCOUNTER — Encounter: Payer: Self-pay | Admitting: Internal Medicine

## 2017-08-03 VITALS — BP 118/74 | HR 65 | Resp 16 | Ht 71.35 in | Wt 126.0 lb

## 2017-08-03 DIAGNOSIS — Z0000 Encounter for general adult medical examination without abnormal findings: Secondary | ICD-10-CM | POA: Diagnosis not present

## 2017-08-03 DIAGNOSIS — Z1211 Encounter for screening for malignant neoplasm of colon: Secondary | ICD-10-CM | POA: Diagnosis not present

## 2017-08-03 DIAGNOSIS — R636 Underweight: Secondary | ICD-10-CM | POA: Diagnosis not present

## 2017-08-03 DIAGNOSIS — K589 Irritable bowel syndrome without diarrhea: Secondary | ICD-10-CM | POA: Diagnosis not present

## 2017-08-03 DIAGNOSIS — M810 Age-related osteoporosis without current pathological fracture: Secondary | ICD-10-CM

## 2017-08-03 DIAGNOSIS — E559 Vitamin D deficiency, unspecified: Secondary | ICD-10-CM | POA: Diagnosis not present

## 2017-08-03 LAB — POCT URINALYSIS DIPSTICK
Bilirubin, UA: NEGATIVE
Blood, UA: NEGATIVE
Glucose, UA: NEGATIVE
KETONES UA: NEGATIVE
Leukocytes, UA: NEGATIVE
Nitrite, UA: NEGATIVE
PH UA: 7 (ref 5.0–8.0)
PROTEIN UA: NEGATIVE
UROBILINOGEN UA: 0.2 U/dL

## 2017-08-03 NOTE — Progress Notes (Signed)
Date:  1/93/7902   Name:  Jessica Monroe   DOB:  01-Dec-1945   MRN:  [id number]   Chief Complaint: Annual Exam (questions about hx of cervical cancer ) Jessica Monroe is a 72 y.o. female who presents today for her Complete Annual Exam. She feels well. She reports exercising regularly. She reports she is sleeping well.  She has declined to continue mammograms.  She does not wish to have a colonoscopy or any immunizations.  FIT testing was done last year. She has OP and takes calcium and vitamin D and remains very active.  She does not want another DEXA at this time. She lost 20 lbs last year during a trip to [place].  She has gained back much of that weight.  Currently feeling slightly stressed - planning to move to [place] with her fiancee and will need to find another support group in that area.  She is not depressed - very happy in the relationship.  Review of Systems  Constitutional: Negative for chills, fatigue and fever.  HENT: Negative for congestion, hearing loss, tinnitus, trouble swallowing and voice change.   Eyes: Negative for visual disturbance.  Respiratory: Negative for cough, chest tightness, shortness of breath and wheezing.   Cardiovascular: Negative for chest pain, palpitations and leg swelling.  Gastrointestinal: Negative for abdominal distention, abdominal pain, constipation, diarrhea and vomiting.  Endocrine: Negative for polydipsia and polyuria.  Genitourinary: Negative for dysuria, frequency, genital sores, pelvic pain, vaginal bleeding and vaginal discharge.  Musculoskeletal: Positive for arthralgias (left shoulder strain). Negative for gait problem and joint swelling.  Skin: Negative for color change and rash.  Neurological: Negative for dizziness, tremors, light-headedness and headaches.  Hematological: Negative for adenopathy. Does not bruise/bleed easily.  Psychiatric/Behavioral: Negative for dysphoric mood and sleep disturbance. The patient is not nervous/anxious.      Patient Active Problem List   Diagnosis Date Noted  . Osteoporosis 04/13/2015  . IBS (irritable bowel syndrome) 03/10/2015  . Vitamin D deficiency 03/10/2015  . Menopause syndrome 03/10/2015  . Hx of nonmelanoma skin cancer 12/15/2014    Prior to Admission medications   Medication Sig Start Date End Date Taking? Authorizing Provider  calcium-vitamin D (OSCAL WITH D) 500-200 MG-UNIT tablet Take 1 tablet by mouth.   Yes [provider]  Docosahexaenoic Acid (DHA) 200 MG CAPS Take by mouth.   Yes [provider]  Multiple Vitamin (MULTI-VITAMINS) TABS Take 1 tablet by mouth daily.   Yes [provider]    Allergies  Allergen Reactions  . Sulfacetamide Sodium Hives  . Sulfasalazine Hives  . Latex   . Sulfa Antibiotics Hives  . Tropicamide Other (See Comments)    Dilation lasts 4 days and she feels "terrible"  (drops used to dilate eyes)    Past Surgical History:  Procedure Laterality Date  . DILATION AND CURETTAGE OF UTERUS    . TUBAL LIGATION      Social History   Tobacco Use  . Smoking status: Former Research scientist (life sciences)  . Smokeless tobacco: Never Used  Substance Use Topics  . Alcohol use: No    Alcohol/week: 0.0 oz  . Drug use: No   Wt Readings from Last 3 Encounters:  08/03/17 126 lb (57.2 kg)  08/02/16 134 lb (60.8 kg)  04/06/15 143 lb (64.9 kg)     Medication list has been reviewed and updated.  PHQ 2/9 Scores 08/03/2017 08/02/2016 04/06/2015  PHQ - 2 Score 2 0 0  PHQ- 9 Score 4 - -  Physical Exam  Constitutional: She is oriented to person, place, and time. She appears well-developed and well-nourished. No distress.  HENT:  Head: Normocephalic and atraumatic.  Right Ear: Tympanic membrane and ear canal normal.  Left Ear: Tympanic membrane and ear canal normal.  Nose: Right sinus exhibits no maxillary sinus tenderness. Left sinus exhibits no maxillary sinus tenderness.  Mouth/Throat: Uvula is midline and oropharynx is clear and moist.   Eyes: Conjunctivae and EOM are normal. Right eye exhibits no discharge. Left eye exhibits no discharge. No scleral icterus.  Neck: Normal range of motion. Carotid bruit is not present. No erythema present. No thyromegaly present.  Cardiovascular: Normal rate, regular rhythm, normal heart sounds and normal pulses.  Pulmonary/Chest: Effort normal. No respiratory distress. She has no wheezes. Right breast exhibits no mass, no nipple discharge, no skin change and no tenderness. Left breast exhibits no mass, no nipple discharge, no skin change and no tenderness.  Abdominal: Soft. Bowel sounds are normal. There is no hepatosplenomegaly. There is no tenderness. There is no CVA tenderness.  Musculoskeletal: Normal range of motion.  Lymphadenopathy:    She has no cervical adenopathy.    She has no axillary adenopathy.  Neurological: She is alert and oriented to person, place, and time. She has normal reflexes. No cranial nerve deficit or sensory deficit.  Skin: Skin is warm, dry and intact. No rash noted.  Psychiatric: She has a normal mood and affect. Her speech is normal and behavior is normal. Thought content normal.  Nursing note and vitals reviewed.   BP 118/74   Pulse 65   Resp 16   Ht 5' 11.35" (1.812 m)   Wt 126 lb (57.2 kg)   SpO2 97%   BMI 17.40 kg/m   Assessment and Plan: 1. Annual physical exam Normal except for underweight - pt says this is improving (lost 20 lbs last year during a trip to [place]) - Lipid panel - TSH - POCT urinalysis dipstick  2. Colon cancer screening Declines colonoscopy - Fecal occult blood, imunochemical  3. Irritable bowel syndrome, unspecified type stable - CBC with Differential/Platelet - Comprehensive metabolic panel  4. Osteoporosis without current pathological fracture, unspecified osteoporosis type Continue calcium and vitamin D Pt declines DEXA - VITAMIN D 25 Hydroxy (Vit-D Deficiency, Fractures)  5. Vitamin D deficiency supplemented -  VITAMIN D 25 Hydroxy (Vit-D Deficiency, Fractures)  6. Patient underweight Discussed healthy diet and stress reduction   No orders of the defined types were placed in this encounter.   Partially dictated using Editor, commissioning. Any errors are unintentional.  Halina Maidens, MD [facility]  08/03/2017

## 2017-08-04 LAB — COMPREHENSIVE METABOLIC PANEL
ALT: 10 IU/L (ref 0–32)
AST: 34 IU/L (ref 0–40)
Albumin/Globulin Ratio: 2.2 (ref 1.2–2.2)
Albumin: 4.3 g/dL (ref 3.5–4.8)
Alkaline Phosphatase: 65 IU/L (ref 39–117)
BUN/Creatinine Ratio: 13 (ref 12–28)
BUN: 12 mg/dL (ref 8–27)
Bilirubin Total: 0.3 mg/dL (ref 0.0–1.2)
CALCIUM: 9.4 mg/dL (ref 8.7–10.3)
CO2: 27 mmol/L (ref 20–29)
Chloride: 102 mmol/L (ref 96–106)
Creatinine, Ser: 0.89 mg/dL (ref 0.57–1.00)
GFR calc Af Amer: 75 mL/min/{1.73_m2} (ref >59)
GFR, EST NON AFRICAN AMERICAN: 65 mL/min/{1.73_m2} (ref >59)
GLOBULIN, TOTAL: 2 g/dL (ref 1.5–4.5)
Glucose: 56 mg/dL — ABNORMAL LOW (ref 65–99)
Potassium: 4.7 mmol/L (ref 3.5–5.2)
Sodium: 143 mmol/L (ref 134–144)
Total Protein: 6.3 g/dL (ref 6.0–8.5)

## 2017-08-04 LAB — LIPID PANEL
Chol/HDL Ratio: 2.9 ratio (ref 0.0–4.4)
Cholesterol, Total: 191 mg/dL (ref 100–199)
HDL: 67 mg/dL (ref >39)
LDL Calculated: 106 mg/dL — ABNORMAL HIGH (ref 0–99)
Triglycerides: 92 mg/dL (ref 0–149)
VLDL CHOLESTEROL CAL: 18 mg/dL (ref 5–40)

## 2017-08-04 LAB — CBC WITH DIFFERENTIAL/PLATELET
Basophils Absolute: 0 10*3/uL (ref 0.0–0.2)
Basos: 1 %
EOS (ABSOLUTE): 0.1 10*3/uL (ref 0.0–0.4)
EOS: 3 %
HEMATOCRIT: 40.3 % (ref 34.0–46.6)
Hemoglobin: 13.7 g/dL (ref 11.1–15.9)
Immature Grans (Abs): 0 10*3/uL (ref 0.0–0.1)
Immature Granulocytes: 0 %
LYMPHS ABS: 1 10*3/uL (ref 0.7–3.1)
Lymphs: 23 %
MCH: 30.7 pg (ref 26.6–33.0)
MCHC: 34 g/dL (ref 31.5–35.7)
MCV: 90 fL (ref 79–97)
MONOS ABS: 0.5 10*3/uL (ref 0.1–0.9)
Monocytes: 10 %
NEUTROS ABS: 2.8 10*3/uL (ref 1.4–7.0)
Neutrophils: 63 %
PLATELETS: 221 10*3/uL (ref 150–379)
RBC: 4.46 x10E6/uL (ref 3.77–5.28)
RDW: 13.7 % (ref 12.3–15.4)
WBC: 4.4 10*3/uL (ref 3.4–10.8)

## 2017-08-04 LAB — VITAMIN D 25 HYDROXY (VIT D DEFICIENCY, FRACTURES): Vit D, 25-Hydroxy: 31.6 ng/mL (ref 30.0–100.0)

## 2017-08-04 LAB — TSH: TSH: 1.94 u[IU]/mL (ref 0.450–4.500)

## 2017-08-08 LAB — FECAL OCCULT BLOOD, IMMUNOCHEMICAL: Fecal Occult Bld: NEGATIVE

## 2018-05-17 ENCOUNTER — Telehealth: Payer: Self-pay | Admitting: Internal Medicine

## 2018-05-17 NOTE — Telephone Encounter (Signed)
Called to schedule Medicare Annual Wellness Visit with the Nurse Health Advisor. No answer on Home phone and Mobile phone.  Left message to call Lattie Haw at [phone] on Mobile.  No machine at Home phone.    If patient returns call, please note: their last AWV was on 08/02/2016, please schedule AWV with NHA any date AFTER 08/02/2017.  Thank you! For any questions please contact: Janace Hoard at [phone] or Skype [email or web address]

## 2019-01-20 ENCOUNTER — Ambulatory Visit: Payer: Medicare Other

## 2019-01-22 ENCOUNTER — Ambulatory Visit (INDEPENDENT_AMBULATORY_CARE_PROVIDER_SITE_OTHER): Payer: Medicare Other

## 2019-01-22 DIAGNOSIS — Z1211 Encounter for screening for malignant neoplasm of colon: Secondary | ICD-10-CM | POA: Diagnosis not present

## 2019-01-22 DIAGNOSIS — Z0000 Encounter for general adult medical examination without abnormal findings: Secondary | ICD-10-CM | POA: Diagnosis not present

## 2019-01-22 NOTE — Patient Instructions (Signed)
Jessica Monroe , Thank you for taking time to come for your Medicare Wellness Visit. I appreciate your ongoing commitment to your health goals. Please review the following plan we discussed and let me know if I can assist you in the future.   Screening recommendations/referrals: Colonoscopy: Referral to [facility] today.  Mammogram: done 04/13/15 Bone Density: done 04/13/15 Recommended yearly ophthalmology/optometry visit for glaucoma screening and checkup Recommended yearly dental visit for hygiene and checkup  Vaccinations: Influenza vaccine: postponed Pneumococcal vaccine: postponed Tdap vaccine: postponed Shingles vaccine: Shingrix discussed. Please contact your pharmacy for coverage information.   Advanced directives: Advance directive discussed with you today. I have provided a copy for you to complete at home and have notarized. Once this is complete please bring a copy in to our office so we can scan it into your chart.  Conditions/risks identified: Keep up the great work!  Next appointment: Please follow up in one year for your Medicare Annual Wellness visit.     Preventive Care 44 Years and Older, Female Preventive care refers to lifestyle choices and visits with your health care provider that can promote health and wellness. What does preventive care include?  A yearly physical exam. This is also called an annual well check.  Dental exams once or twice a year.  Routine eye exams. Ask your health care provider how often you should have your eyes checked.  Personal lifestyle choices, including:  Daily care of your teeth and gums.  Regular physical activity.  Eating a healthy diet.  Avoiding tobacco and drug use.  Limiting alcohol use.  Practicing safe sex.  Taking low-dose aspirin every day.  Taking vitamin and mineral supplements as recommended by your health care provider. What happens during an annual well check? The services and screenings done  by your health care provider during your annual well check will depend on your age, overall health, lifestyle risk factors, and family history of disease. Counseling  Your health care provider may ask you questions about your:  Alcohol use.  Tobacco use.  Drug use.  Emotional well-being.  Home and relationship well-being.  Sexual activity.  Eating habits.  History of falls.  Memory and ability to understand (cognition).  Work and work Statistician.  Reproductive health. Screening  You may have the following tests or measurements:  Height, weight, and BMI.  Blood pressure.  Lipid and cholesterol levels. These may be checked every 5 years, or more frequently if you are over 82 years old.  Skin check.  Lung cancer screening. You may have this screening every year starting at age 52 if you have a 30-pack-year history of smoking and currently smoke or have quit within the past 15 years.  Fecal occult blood test (FOBT) of the stool. You may have this test every year starting at age 80.  Flexible sigmoidoscopy or colonoscopy. You may have a sigmoidoscopy every 5 years or a colonoscopy every 10 years starting at age 18.  Hepatitis C blood test.  Hepatitis B blood test.  Sexually transmitted disease (STD) testing.  Diabetes screening. This is done by checking your blood sugar (glucose) after you have not eaten for a while (fasting). You may have this done every 1-3 years.  Bone density scan. This is done to screen for osteoporosis. You may have this done starting at age 62.  Mammogram. This may be done every 1-2 years. Talk to your health care provider about how often you should have regular mammograms. Talk with your health care  provider about your test results, treatment options, and if necessary, the need for more tests. Vaccines  Your health care provider may recommend certain vaccines, such as:  Influenza vaccine. This is recommended every year.  Tetanus,  diphtheria, and acellular pertussis (Tdap, Td) vaccine. You may need a Td booster every 10 years.  Zoster vaccine. You may need this after age 22.  Pneumococcal 13-valent conjugate (PCV13) vaccine. One dose is recommended after age 18.  Pneumococcal polysaccharide (PPSV23) vaccine. One dose is recommended after age 72. Talk to your health care provider about which screenings and vaccines you need and how often you need them. This information is not intended to replace advice given to you by your health care provider. Make sure you discuss any questions you have with your health care provider. Document Released: 04/02/2015 Document Revised: 11/24/2015 Document Reviewed: 01/05/2015 Elsevier Interactive Patient Education  2017 [place] Prevention in the Home Falls can cause injuries. They can happen to people of all ages. There are many things you can do to make your home safe and to help prevent falls. What can I do on the outside of my home?  Regularly fix the edges of walkways and driveways and fix any cracks.  Remove anything that might make you trip as you walk through a door, such as a raised step or threshold.  Trim any bushes or trees on the path to your home.  Use bright outdoor lighting.  Clear any walking paths of anything that might make someone trip, such as rocks or tools.  Regularly check to see if handrails are loose or broken. Make sure that both sides of any steps have handrails.  Any raised decks and porches should have guardrails on the edges.  Have any leaves, snow, or ice cleared regularly.  Use sand or salt on walking paths during winter.  Clean up any spills in your garage right away. This includes oil or grease spills. What can I do in the bathroom?  Use night lights.  Install grab bars by the toilet and in the tub and shower. Do not use towel bars as grab bars.  Use non-skid mats or decals in the tub or shower.  If you need to sit down in  the shower, use a plastic, non-slip stool.  Keep the floor dry. Clean up any water that spills on the floor as soon as it happens.  Remove soap buildup in the tub or shower regularly.  Attach bath mats securely with double-sided non-slip rug tape.  Do not have throw rugs and other things on the floor that can make you trip. What can I do in the bedroom?  Use night lights.  Make sure that you have a light by your bed that is easy to reach.  Do not use any sheets or blankets that are too big for your bed. They should not hang down onto the floor.  Have a firm chair that has side arms. You can use this for support while you get dressed.  Do not have throw rugs and other things on the floor that can make you trip. What can I do in the kitchen?  Clean up any spills right away.  Avoid walking on wet floors.  Keep items that you use a lot in easy-to-reach places.  If you need to reach something above you, use a strong step stool that has a grab bar.  Keep electrical cords out of the way.  Do not use floor polish  or wax that makes floors slippery. If you must use wax, use non-skid floor wax.  Do not have throw rugs and other things on the floor that can make you trip. What can I do with my stairs?  Do not leave any items on the stairs.  Make sure that there are handrails on both sides of the stairs and use them. Fix handrails that are broken or loose. Make sure that handrails are as long as the stairways.  Check any carpeting to make sure that it is firmly attached to the stairs. Fix any carpet that is loose or worn.  Avoid having throw rugs at the top or bottom of the stairs. If you do have throw rugs, attach them to the floor with carpet tape.  Make sure that you have a light switch at the top of the stairs and the bottom of the stairs. If you do not have them, ask someone to add them for you. What else can I do to help prevent falls?  Wear shoes that:  Do not have high  heels.  Have rubber bottoms.  Are comfortable and fit you well.  Are closed at the toe. Do not wear sandals.  If you use a stepladder:  Make sure that it is fully opened. Do not climb a closed stepladder.  Make sure that both sides of the stepladder are locked into place.  Ask someone to hold it for you, if possible.  Clearly mark and make sure that you can see:  Any grab bars or handrails.  First and last steps.  Where the edge of each step is.  Use tools that help you move around (mobility aids) if they are needed. These include:  Canes.  Walkers.  Scooters.  Crutches.  Turn on the lights when you go into a dark area. Replace any light bulbs as soon as they burn out.  Set up your furniture so you have a clear path. Avoid moving your furniture around.  If any of your floors are uneven, fix them.  If there are any pets around you, be aware of where they are.  Review your medicines with your doctor. Some medicines can make you feel dizzy. This can increase your chance of falling. Ask your doctor what other things that you can do to help prevent falls. This information is not intended to replace advice given to you by your health care provider. Make sure you discuss any questions you have with your health care provider. Document Released: 12/31/2008 Document Revised: 08/12/2015 Document Reviewed: 04/10/2014 Elsevier Interactive Patient Education  2017 [employer].

## 2019-01-22 NOTE — Progress Notes (Signed)
Subjective:   Jessica Monroe is a 73 y.o. female who presents for Medicare Annual (Subsequent) preventive examination.  Virtual Visit via Telephone Note  I connected with Jessica Monroe on [id number] at  9:20 AM EST by telephone and verified that I am speaking with the correct person using two identifiers.  Medicare Annual Wellness visit completed telephonically due to Covid-19 pandemic.   Location: Patient: home Provider: office   I discussed the limitations, risks, security and privacy concerns of performing an evaluation and management service by telephone and the availability of in person appointments. The patient expressed understanding and agreed to proceed.  Some vital signs may be absent or patient reported.   [name], LPN    Review of Systems:   Cardiac Risk Factors include: advanced age (>26men, >31 women)     Objective:     Vitals: There were no vitals taken for this visit.  There is no height or weight on file to calculate BMI.  Advanced Directives 01/22/2019 08/02/2016 04/06/2015  Does Patient Have a Medical Advance Directive? No No No  Would patient like information on creating a medical advance directive? Yes (MAU/Ambulatory/Procedural Areas - Information given) - No - patient declined information    Tobacco Social History   Tobacco Use  Smoking Status Former Smoker  Smokeless Tobacco Never Used     Counseling given: Not Answered   Clinical Intake:  Pre-visit preparation completed: Yes  Pain : 0-10 Pain Type: Chronic pain Pain Location: Shoulder Pain Orientation: Right Pain Descriptors / Indicators: Burning Pain Onset: More than a month ago Pain Frequency: Constant     Nutritional Risks: None Diabetes: No  How often do you need to have someone help you when you read instructions, pamphlets, or other written materials from your doctor or pharmacy?: 1 - Never  Interpreter Needed?: No  Information entered by :: [name] LPN  Past  Medical History:  Diagnosis Date  . Cancer ([place])    skin ca   Past Surgical History:  Procedure Laterality Date  . DILATION AND CURETTAGE OF UTERUS    . TUBAL LIGATION     Family History  Problem Relation Age of Onset  . Ovarian cancer Mother 32  . Bipolar disorder Father    Social History   Socioeconomic History  . Marital status: Single    Spouse name: Not on file  . Number of children: 4  . Years of education: Not on file  . Highest education level: Not on file  Occupational History  . Not on file  Social Needs  . Financial resource strain: Not hard at all  . Food insecurity    Worry: Never true    Inability: Never true  . Transportation needs    Medical: No    Non-medical: No  Tobacco Use  . Smoking status: Former Research scientist (life sciences)  . Smokeless tobacco: Never Used  Substance and Sexual Activity  . Alcohol use: No    Alcohol/week: 0.0 standard drinks  . Drug use: No  . Sexual activity: Not on file  Lifestyle  . Physical activity    Days per week: 7 days    Minutes per session: 60 min  . Stress: Only a little  Relationships  . Social Herbalist on phone: Patient refused    Gets together: Patient refused    Attends religious service: Patient refused    Active member of club or organization: Patient refused    Attends meetings of clubs  or organizations: Patient refused    Relationship status: Living with partner  Other Topics Concern  . Not on file  Social History Narrative  . Not on file    Outpatient Encounter Medications as of 01/22/2019  Medication Sig  . calcium-vitamin D (OSCAL WITH D) 500-200 MG-UNIT tablet Take 1 tablet by mouth.  . Docosahexaenoic Acid (DHA) 200 MG CAPS Take by mouth.  Marland Kitchen glucosamine-chondroitin 500-400 MG tablet Take 1 tablet by mouth 3 (three) times daily.  . Multiple Vitamin (MULTI-VITAMINS) TABS Take 1 tablet by mouth daily.   No facility-administered encounter medications on file as of 01/22/2019.     Activities of Daily  Living In your present state of health, do you have any difficulty performing the following activities: 01/22/2019  Hearing? N  Comment declines hearing aids  Vision? N  Difficulty concentrating or making decisions? N  Walking or climbing stairs? N  Dressing or bathing? N  Doing errands, shopping? N  Preparing Food and eating ? N  Using the Toilet? N  In the past six months, have you accidently leaked urine? N  Do you have problems with loss of bowel control? N  Managing your Medications? N  Managing your Finances? N  Housekeeping or managing your Housekeeping? N  Some recent data might be hidden    Patient Care Team: Glean Hess, MD as PCP - General (Family Medicine)    Assessment:   This is a routine wellness examination for Jessica Monroe.  Exercise Activities and Dietary recommendations Current Exercise Habits: Home exercise routine, Type of exercise: walking, Time (Minutes): 60, Frequency (Times/Week): 7, Weekly Exercise (Minutes/Week): 420, Intensity: Moderate, Exercise limited by: None identified  Goals   None     Fall Risk Fall Risk  01/22/2019 01/22/2019 08/03/2017 08/02/2016 04/06/2015  Falls in the past year? 0 0 No No No  Number falls in past yr: 0 0 - - -  Injury with Fall? 0 0 - - -  Follow up Falls prevention discussed Falls prevention discussed - - -   FALL RISK PREVENTION PERTAINING TO THE HOME:  Any stairs in or around the home? Yes  If so, do they handrails? Yes   Home free of loose throw rugs in walkways, pet beds, electrical cords, etc? Yes  Adequate lighting in your home to reduce risk of falls? Yes   ASSISTIVE DEVICES UTILIZED TO PREVENT FALLS:  Life alert? No  Use of a cane, walker or w/c? No  Grab bars in the bathroom? Yes  Shower chair or bench in shower? No  Elevated toilet seat or a handicapped toilet? No   DME ORDERS:  DME order needed?  No   TIMED UP AND GO:  Was the test performed? No . Telephonic visit.   Education: Fall risk  prevention has been discussed.  Intervention(s) required? No   Depression Screen PHQ 2/9 Scores 01/22/2019 08/03/2017 08/02/2016 04/06/2015  PHQ - 2 Score 1 2 0 0  PHQ- 9 Score - 4 - -     Cognitive Function - 6CIT deferred for 2020 AWV. Pt has no memory issues.      6CIT Screen 08/02/2016  What Year? 0 points  What month? 0 points  What time? 0 points  Count back from 20 0 points  Months in reverse 2 points  Repeat phrase 0 points  Total Score 2    Immunization History  Administered Date(s) Administered  . Hepatitis A, Adult 12/21/2016    Qualifies for Shingles Vaccine? Yes .  Due for Shingrix. Education has been provided regarding the importance of this vaccine. Pt has been advised to call insurance company to determine out of pocket expense. Advised may also receive vaccine at local pharmacy or Health Dept. Verbalized acceptance and understanding.  Tdap: Although this vaccine is not a covered service during a Wellness Exam, does the patient still wish to receive this vaccine today?  No .  Education has been provided regarding the importance of this vaccine. Advised may receive this vaccine at local pharmacy or Health Dept. Aware to provide a copy of the vaccination record if obtained from local pharmacy or Health Dept. Verbalized acceptance and understanding.  Flu Vaccine: Due for Flu vaccine. Does the patient want to receive this vaccine today?  No . Education has been provided regarding the importance of this vaccine but still declined. Advised may receive this vaccine at local pharmacy or Health Dept. Aware to provide a copy of the vaccination record if obtained from local pharmacy or Health Dept. Verbalized acceptance and understanding.  Pneumococcal Vaccine: Due for Pneumococcal vaccine. Does the patient want to receive this vaccine today?  No . Education has been provided regarding the importance of this vaccine but still declined. Advised may receive this vaccine at local  pharmacy or Health Dept. Aware to provide a copy of the vaccination record if obtained from local pharmacy or Health Dept. Verbalized acceptance and understanding.   Screening Tests Health Maintenance  Topic Date Due  . COLONOSCOPY  03/07/1996  . MAMMOGRAM  04/13/2016  . INFLUENZA VACCINE  06/18/2019 (Originally 10/19/2018)  . DEXA SCAN  Completed  . Hepatitis C Screening  Completed  . TETANUS/TDAP  Discontinued  . PNA vac Low Risk Adult  Discontinued    Cancer Screenings:  Colorectal Screening: Not completed.  Referral to GI placed today. Pt aware the office will call re: appt.  Mammogram: Completed 04/14/15. Repeat every year; Pt declines repeat screening at this time.   Bone Density: Completed 04/13/15. Results reflect OSTEOPOROSIS. Repeat every 2 years. Pt declines repeat screening at this time.   Lung Cancer Screening: (Low Dose CT Chest recommended if Age 64-80 years, 30 pack-year currently smoking OR have quit w/in 15years.) does not qualify.   Additional Screening:  Hepatitis C Screening: does qualify; Completed 04/06/15  Vision Screening: Recommended annual ophthalmology exams for early detection of glaucoma and other disorders of the eye. Is the patient up to date with their annual eye exam?  Yes  Who is the provider or what is the name of the office in which the pt attends annual eye exams? [place] Screening: Recommended annual dental exams for proper oral hygiene  Community Resource Referral:  CRR required this visit?  No      Plan:     I have personally reviewed and addressed the Medicare Annual Wellness questionnaire and have noted the following in the patient's chart:  A. Medical and social history B. Use of alcohol, tobacco or illicit drugs  C. Current medications and supplements D. Functional ability and status E.  Nutritional status F.  Physical activity G. Advance directives H. List of other physicians I.  Hospitalizations,  surgeries, and ER visits in previous 12 months J.  [place] such as hearing and vision if needed, cognitive and depression L. Referrals and appointments   In addition, I have reviewed and discussed with patient certain preventive protocols, quality metrics, and best practice recommendations. A written personalized care plan for preventive services as well  as general preventive health recommendations were provided to patient.   Signed,  [name], LPN Nurse Health Advisor   Nurse Notes: pt has been in [place] during majority of 2020 due to Covid. She is scheduled to see Dr. Army Melia 02/03/19 and advised due for labs.

## 2019-01-23 ENCOUNTER — Telehealth: Payer: Self-pay | Admitting: Gastroenterology

## 2019-01-23 NOTE — Telephone Encounter (Signed)
Pt left vm returning a call °

## 2019-01-23 NOTE — Telephone Encounter (Signed)
Returned patients call to schedule her colonoscopy.  She asked for a call back in a few minutes.  I told her I will touch base with her tomorrow.  Thanks [employer]

## 2019-01-30 ENCOUNTER — Telehealth: Payer: Self-pay | Admitting: Gastroenterology

## 2019-01-30 ENCOUNTER — Other Ambulatory Visit: Payer: Self-pay

## 2019-01-30 DIAGNOSIS — Z1211 Encounter for screening for malignant neoplasm of colon: Secondary | ICD-10-CM

## 2019-01-30 NOTE — Telephone Encounter (Signed)
Gastroenterology Pre-Procedure Review  Request Date: 02/21/19 Requesting Physician: Dr. Bonna Gains  PATIENT REVIEW QUESTIONS: The patient responded to the following health history questions as indicated:    1. Are you having any GI issues? no 2. Do you have a personal history of Polyps? no 3. Do you have a family history of Colon Cancer or Polyps? no 4. Diabetes Mellitus? no 5. Joint replacements in the past 12 months?no 6. Major health problems in the past 3 months?no 7. Any artificial heart valves, MVP, or defibrillator?no    MEDICATIONS & ALLERGIES:    Patient reports the following regarding taking any anticoagulation/antiplatelet therapy:   Plavix, Coumadin, Eliquis, Xarelto, Lovenox, Pradaxa, Brilinta, or Effient? no Aspirin? no  Patient confirms/reports the following medications:  Current Outpatient Medications  Medication Sig Dispense Refill  . calcium-vitamin D (OSCAL WITH D) 500-200 MG-UNIT tablet Take 1 tablet by mouth.    . Docosahexaenoic Acid (DHA) 200 MG CAPS Take by mouth.    Marland Kitchen glucosamine-chondroitin 500-400 MG tablet Take 1 tablet by mouth 3 (three) times daily.    . Multiple Vitamin (MULTI-VITAMINS) TABS Take 1 tablet by mouth daily.     No current facility-administered medications for this visit.     Patient confirms/reports the following allergies:  Allergies  Allergen Reactions  . Sulfacetamide Sodium Hives  . Sulfasalazine Hives  . Latex   . Sulfa Antibiotics Hives  . Tropicamide Other (See Comments)    Dilation lasts 4 days and she feels "terrible"  (drops used to dilate eyes)    No orders of the defined types were placed in this encounter.   AUTHORIZATION INFORMATION Primary Insurance: 1D#: Group #:  Secondary Insurance: 1D#: Group #:  SCHEDULE INFORMATION: Date: 02/21/19 Time: Location:

## 2019-01-30 NOTE — Telephone Encounter (Signed)
Pt left vm to schedule a colonoscopy   °

## 2019-01-31 ENCOUNTER — Encounter: Payer: Medicare Other | Admitting: Internal Medicine

## 2019-02-03 ENCOUNTER — Other Ambulatory Visit: Payer: Self-pay

## 2019-02-03 ENCOUNTER — Encounter: Payer: Self-pay | Admitting: Internal Medicine

## 2019-02-03 ENCOUNTER — Ambulatory Visit (INDEPENDENT_AMBULATORY_CARE_PROVIDER_SITE_OTHER): Payer: Medicare Other | Admitting: Internal Medicine

## 2019-02-03 VITALS — BP 122/58 | HR 73 | Ht 71.0 in | Wt 124.6 lb

## 2019-02-03 DIAGNOSIS — Z1211 Encounter for screening for malignant neoplasm of colon: Secondary | ICD-10-CM | POA: Diagnosis not present

## 2019-02-03 DIAGNOSIS — Z0000 Encounter for general adult medical examination without abnormal findings: Secondary | ICD-10-CM | POA: Diagnosis not present

## 2019-02-03 DIAGNOSIS — Z1231 Encounter for screening mammogram for malignant neoplasm of breast: Secondary | ICD-10-CM

## 2019-02-03 DIAGNOSIS — M810 Age-related osteoporosis without current pathological fracture: Secondary | ICD-10-CM | POA: Diagnosis not present

## 2019-02-03 DIAGNOSIS — M7521 Bicipital tendinitis, right shoulder: Secondary | ICD-10-CM | POA: Diagnosis not present

## 2019-02-03 DIAGNOSIS — Z789 Other specified health status: Secondary | ICD-10-CM

## 2019-02-03 LAB — POCT URINALYSIS DIPSTICK
Bilirubin, UA: NEGATIVE
Blood, UA: NEGATIVE
Glucose, UA: NEGATIVE
Ketones, UA: NEGATIVE
Leukocytes, UA: NEGATIVE
Nitrite, UA: NEGATIVE
Protein, UA: NEGATIVE
Spec Grav, UA: 1.02 (ref 1.010–1.025)
Urobilinogen, UA: 0.2 E.U./dL
pH, UA: 5 (ref 5.0–8.0)

## 2019-02-03 NOTE — Progress Notes (Signed)
Date:  [id number]   Name:  Jessica Monroe   DOB:  03/31/1945   MRN:  [id number]   Chief Complaint: Annual Exam (Breast Exam. High dose flu shot. ) Jessica Monroe is a 73 y.o. female who presents today for her Complete Annual Exam. She feels well. She reports exercising regularly. She reports she is sleeping well. She spent the last 8 months in [place].  Staying busy with her fiancee - gardening and walking. She denies breast issues.  Last mammogram 2017 was painful.  DEXA in 2017 - localized OP.  Mammogram  2017 Immunizations - none; agrees to flu vaccine DEXA   2017 Colonoscopy - scheduled  Shoulder Pain  The pain is present in the right shoulder. This is a new problem. The current episode started more than 1 year ago. The problem occurs intermittently. The problem has been gradually improving. The quality of the pain is described as sharp (intermittent pain at times). Pain severity now: severe for a few seconds with certain movements. Pertinent negatives include no fever or numbness. She has tried nothing for the symptoms.    Lab Results  Component Value Date   CREATININE 0.89 08/03/2017   BUN 12 08/03/2017   NA 143 08/03/2017   K 4.7 08/03/2017   CL 102 08/03/2017   CO2 27 08/03/2017   Lab Results  Component Value Date   CHOL 191 08/03/2017   HDL 67 08/03/2017   LDLCALC 106 (H) 08/03/2017   TRIG 92 08/03/2017   CHOLHDL 2.9 08/03/2017   Lab Results  Component Value Date   TSH 1.940 08/03/2017   No results found for: HGBA1C   Review of Systems  Constitutional: Negative for chills, fatigue and fever.  HENT: Negative for congestion, hearing loss, tinnitus, trouble swallowing and voice change.   Eyes: Negative for visual disturbance.  Respiratory: Negative for cough, chest tightness, shortness of breath and wheezing.   Cardiovascular: Negative for chest pain, palpitations and leg swelling.  Gastrointestinal: Negative for abdominal pain, constipation, diarrhea  and vomiting.  Endocrine: Negative for polydipsia and polyuria.  Genitourinary: Negative for dysuria, frequency, genital sores, vaginal bleeding and vaginal discharge.  Musculoskeletal: Positive for arthralgias (right shoulder). Negative for gait problem, joint swelling, neck pain and neck stiffness.  Skin: Negative for color change and rash.  Allergic/Immunologic: Negative for environmental allergies.  Neurological: Positive for weakness. Negative for dizziness, tremors, light-headedness, numbness and headaches.  Hematological: Negative for adenopathy. Does not bruise/bleed easily.  Psychiatric/Behavioral: Negative for dysphoric mood and sleep disturbance. The patient is not nervous/anxious.     Patient Active Problem List   Diagnosis Date Noted  . Biceps tendonitis on right 02/03/2019  . Osteoporosis 04/13/2015  . IBS (irritable bowel syndrome) 03/10/2015  . Vitamin D deficiency 03/10/2015  . Menopause syndrome 03/10/2015  . Hx of nonmelanoma skin cancer 12/15/2014    Allergies  Allergen Reactions  . Sulfacetamide Sodium Hives  . Sulfasalazine Hives  . Latex   . Sulfa Antibiotics Hives  . Tropicamide Other (See Comments)    Dilation lasts 4 days and she feels "terrible"  (drops used to dilate eyes)    Past Surgical History:  Procedure Laterality Date  . DILATION AND CURETTAGE OF UTERUS    . TUBAL LIGATION      Social History   Tobacco Use  . Smoking status: Former Research scientist (life sciences)  . Smokeless tobacco: Never Used  Substance Use Topics  . Alcohol use: No    Alcohol/week: 0.0 standard drinks  .  Drug use: No     Medication list has been reviewed and updated.  Current Meds  Medication Sig  . calcium-vitamin D (OSCAL WITH D) 500-200 MG-UNIT tablet Take 1 tablet by mouth.  . Docosahexaenoic Acid (DHA) 200 MG CAPS Take by mouth.  Marland Kitchen glucosamine-chondroitin 500-400 MG tablet Take 1 tablet by mouth 3 (three) times daily.  . Multiple Vitamin (MULTI-VITAMINS) TABS Take 1 tablet by  mouth daily.    PHQ 2/9 Scores 02/03/2019 01/22/2019 08/03/2017 08/02/2016  PHQ - 2 Score 0 1 2 0  PHQ- 9 Score 5 - 4 -    BP Readings from Last 3 Encounters:  02/03/19 (!) 122/58  08/03/17 118/74  08/02/16 128/78    Physical Exam Vitals signs and nursing note reviewed.  Constitutional:      General: She is not in acute distress.    Appearance: Normal appearance. She is well-developed.     Comments: Thin but healthy appearing  HENT:     Head: Normocephalic and atraumatic.     Right Ear: Tympanic membrane and ear canal normal.     Left Ear: Tympanic membrane and ear canal normal.     Nose:     Right Sinus: No maxillary sinus tenderness.     Left Sinus: No maxillary sinus tenderness.  Eyes:     General: No scleral icterus.       Right eye: No discharge.        Left eye: No discharge.     Conjunctiva/sclera: Conjunctivae normal.  Neck:     Musculoskeletal: Normal range of motion. No erythema.     Thyroid: No thyromegaly.     Vascular: No carotid bruit.  Cardiovascular:     Rate and Rhythm: Normal rate and regular rhythm.     Pulses: Normal pulses.     Heart sounds: Normal heart sounds.  Pulmonary:     Effort: Pulmonary effort is normal. No respiratory distress.     Breath sounds: No wheezing.  Chest:     Breasts:        Right: No mass, nipple discharge, skin change or tenderness.        Left: No mass, nipple discharge, skin change or tenderness.  Abdominal:     General: Bowel sounds are normal.     Palpations: Abdomen is soft.     Tenderness: There is no abdominal tenderness.  Musculoskeletal:        General: No swelling or deformity.     Right shoulder: She exhibits tenderness (over medial biceps ).     Right lower leg: Edema (varicose veins both legs) present.     Left lower leg: No edema.  Lymphadenopathy:     Cervical: No cervical adenopathy.  Skin:    General: Skin is warm and dry.     Capillary Refill: Capillary refill takes less than 2 seconds.      Findings: No rash.  Neurological:     General: No focal deficit present.     Mental Status: She is alert and oriented to person, place, and time.     Cranial Nerves: Cranial nerves are intact. No cranial nerve deficit.     Sensory: Sensation is intact. No sensory deficit.     Motor: Motor function is intact.     Deep Tendon Reflexes: Reflexes are normal and symmetric.  Psychiatric:        Attention and Perception: Attention normal.        Mood and Affect: Mood normal.  Speech: Speech normal.        Behavior: Behavior normal.        Thought Content: Thought content normal.        Cognition and Memory: Cognition normal.     Wt Readings from Last 3 Encounters:  02/03/19 124 lb 9.6 oz (56.5 kg)  08/03/17 126 lb (57.2 kg)  08/02/16 134 lb (60.8 kg)    BP (!) 122/58   Pulse 73   Ht 5\' 11"  (1.803 m) Comment: Patient measured today!  Wt 124 lb 9.6 oz (56.5 kg)   SpO2 98%   BMI 17.38 kg/m   Assessment and Plan: 1. Annual physical exam Normal exam except for mildly underweight - pt has always been thin; lost 10 lbs in 2018 when she became a vegetarian.  Since then weight is only down 2 lbs.  She is encouraged to continue to find high protein foods to supplement her vegetarian diet - CBC with Differential/Platelet - Comprehensive metabolic panel - Lipid panel - TSH + free T4 - POCT urinalysis dipstick  2. Biceps tendonitis on right Continue topical rubs, heat or ice Tylenol if needed Can refer to Ortho if worsening  3. Colon cancer screening Colonoscopy scheduled  4. Encounter for screening mammogram for breast cancer Pt is considering mammogram at [facility] or [facility] so written Rx is given  5. Age related osteoporosis, unspecified pathological fracture presence Continue Vitamin D and calcium plus daily weight bearing exercise Consider repeat DEXA at the same time as the mammogram discussed above.   Partially dictated using Editor, commissioning. Any errors are unintentional.   Halina Maidens, MD [facility]  02/03/2019

## 2019-02-03 NOTE — Patient Instructions (Signed)
You can call to schedule the Pneumonia vaccine in about 6 weeks if desired (Prevnar-13). You would then need to get the second vaccine (PPV-23) in one year

## 2019-02-04 LAB — COMPREHENSIVE METABOLIC PANEL
ALT: 10 IU/L (ref 0–32)
AST: 42 IU/L — ABNORMAL HIGH (ref 0–40)
Albumin/Globulin Ratio: 1.8 (ref 1.2–2.2)
Albumin: 4.7 g/dL (ref 3.7–4.7)
Alkaline Phosphatase: 62 IU/L (ref 39–117)
BUN/Creatinine Ratio: 22 (ref 12–28)
BUN: 17 mg/dL (ref 8–27)
Bilirubin Total: 0.4 mg/dL (ref 0.0–1.2)
CO2: 26 mmol/L (ref 20–29)
Calcium: 9.6 mg/dL (ref 8.7–10.3)
Chloride: 101 mmol/L (ref 96–106)
Creatinine, Ser: 0.76 mg/dL (ref 0.57–1.00)
GFR calc Af Amer: 91 mL/min/{1.73_m2} (ref >59)
GFR calc non Af Amer: 79 mL/min/{1.73_m2} (ref >59)
Globulin, Total: 2.6 g/dL (ref 1.5–4.5)
Glucose: 91 mg/dL (ref 65–99)
Potassium: 5.2 mmol/L (ref 3.5–5.2)
Sodium: 141 mmol/L (ref 134–144)
Total Protein: 7.3 g/dL (ref 6.0–8.5)

## 2019-02-04 LAB — LIPID PANEL
Chol/HDL Ratio: 2.4 ratio (ref 0.0–4.4)
Cholesterol, Total: 200 mg/dL — ABNORMAL HIGH (ref 100–199)
HDL: 84 mg/dL (ref >39)
LDL Chol Calc (NIH): 108 mg/dL — ABNORMAL HIGH (ref 0–99)
Triglycerides: 43 mg/dL (ref 0–149)
VLDL Cholesterol Cal: 8 mg/dL (ref 5–40)

## 2019-02-04 LAB — CBC WITH DIFFERENTIAL/PLATELET
Basophils Absolute: 0.1 10*3/uL (ref 0.0–0.2)
Basos: 1 %
EOS (ABSOLUTE): 0.1 10*3/uL (ref 0.0–0.4)
Eos: 1 %
Hematocrit: 40.9 % (ref 34.0–46.6)
Hemoglobin: 13.8 g/dL (ref 11.1–15.9)
Immature Grans (Abs): 0 10*3/uL (ref 0.0–0.1)
Immature Granulocytes: 0 %
Lymphocytes Absolute: 1.1 10*3/uL (ref 0.7–3.1)
Lymphs: 25 %
MCH: 31.2 pg (ref 26.6–33.0)
MCHC: 33.7 g/dL (ref 31.5–35.7)
MCV: 93 fL (ref 79–97)
Monocytes Absolute: 0.4 10*3/uL (ref 0.1–0.9)
Monocytes: 10 %
Neutrophils Absolute: 2.8 10*3/uL (ref 1.4–7.0)
Neutrophils: 63 %
Platelets: 229 10*3/uL (ref 150–450)
RBC: 4.42 x10E6/uL (ref 3.77–5.28)
RDW: 12.3 % (ref 11.7–15.4)
WBC: 4.4 10*3/uL (ref 3.4–10.8)

## 2019-02-04 LAB — TSH+FREE T4
Free T4: 1.09 ng/dL (ref 0.82–1.77)
TSH: 2.61 u[IU]/mL (ref 0.450–4.500)

## 2019-02-05 ENCOUNTER — Telehealth: Payer: Self-pay

## 2019-02-05 NOTE — Telephone Encounter (Signed)
I don't have any way to schedule at [facility] as far as I know.  I can ask Lexine Baton to try to schedule.

## 2019-02-05 NOTE — Telephone Encounter (Signed)
Patient could not get scheduled for her Mammogram at [facility] due to issues when calling them. She has hand written Mammogram and Dexa script but is struggling to get this set up. Would like [place] to schedule this or order as a referral to [facility] and have someone set this up for her. She asked that it be done before 03/03/2019 due to her leaving until Jan 2021. She will be in [place] and mentions having a doctor in [place] that she sees 03/05/2019. I expl;ained that [facility] is very busy and we may not get to have this done that soon. She said she will work it out when she returns if we can not get this set up. No need to call her unless we have a date for these studies.

## 2019-02-05 NOTE — Progress Notes (Signed)
Patient informed. 

## 2019-02-06 NOTE — Telephone Encounter (Signed)
Called and left a detailed msg informing the patient that we have no other way to order a mammogram through [facility] besides Dr. Army Melia writing a script and the pt calling and scheduling. Told her we do not have a [facility] order form. Asked the patient to callback with more information as to what she was told at [facility] along with their telephone number so that I can reach out to their office.  Benedict Needy, CMA

## 2019-02-17 ENCOUNTER — Other Ambulatory Visit: Payer: Self-pay

## 2019-02-17 ENCOUNTER — Encounter: Payer: Self-pay | Admitting: *Deleted

## 2019-02-18 ENCOUNTER — Encounter: Payer: Self-pay | Admitting: Anesthesiology

## 2019-02-18 ENCOUNTER — Other Ambulatory Visit
Admission: RE | Admit: 2019-02-18 | Discharge: 2019-02-18 | Disposition: A | Discharge status: Home / Self Care | Payer: Medicare Other | Source: Ambulatory Visit | Attending: Gastroenterology | Admitting: Gastroenterology

## 2019-02-18 DIAGNOSIS — Z20828 Contact with and (suspected) exposure to other viral communicable diseases: Secondary | ICD-10-CM | POA: Insufficient documentation

## 2019-02-18 DIAGNOSIS — Z01812 Encounter for preprocedural laboratory examination: Secondary | ICD-10-CM | POA: Diagnosis present

## 2019-02-18 NOTE — Anesthesia Preprocedure Evaluation (Deleted)
Anesthesia Evaluation    Airway        Dental   Pulmonary former smoker,           Cardiovascular      Neuro/Psych    GI/Hepatic  IBS   Endo/Other  diabetes  Renal/GU      Musculoskeletal   Abdominal   Peds  Hematology   Anesthesia Other Findings Skin cancer  Reproductive/Obstetrics                             Anesthesia Physical Anesthesia Plan  ASA: I  Anesthesia Plan: General   Post-op Pain Management:    Induction: Intravenous  PONV Risk Score and Plan: 2 and Propofol infusion, TIVA and Treatment may vary due to age or medical condition  Airway Management Planned: Natural Airway and Nasal Cannula  Additional Equipment:   Intra-op Plan:   Post-operative Plan:   Informed Consent: I have reviewed the patients History and Physical, chart, labs and discussed the procedure including the risks, benefits and alternatives for the proposed anesthesia with the patient or authorized representative who has indicated his/her understanding and acceptance.       Plan Discussed with: CRNA and Anesthesiologist  Anesthesia Plan Comments:         Anesthesia Quick Evaluation

## 2019-02-19 ENCOUNTER — Telehealth: Payer: Self-pay

## 2019-02-19 LAB — SARS CORONAVIRUS 2 (TAT 6-24 HRS): SARS Coronavirus 2: NEGATIVE

## 2019-02-19 NOTE — Telephone Encounter (Signed)
Returned patients call regarding her colonoscopy (02/21/19 [place] with Dr. Bonna Gains) .  She would like to cancel her colonoscopy at this time.  She states that she is having some anxieties about the bowel prep to clean to colon out-as she is a vegetarian and doesn't have much to cleanse out of her colon.  She also states that she just does not feel emotionally and physically strong enough to have the colonoscopy, not to mention COVID.  I told her we understand and she can call us back to reschedule her colonoscopy when she is feeling in a better state to do so.  Thanks [employer]

## 2019-02-19 NOTE — Discharge Instructions (Signed)

## 2019-02-21 ENCOUNTER — Ambulatory Visit: Admission: RE | Admit: 2019-02-21 | Payer: Medicare Other | Source: Home / Self Care | Admitting: Gastroenterology

## 2019-02-21 HISTORY — DX: Other complications of anesthesia, initial encounter: T88.59XA

## 2019-02-21 SURGERY — COLONOSCOPY WITH PROPOFOL
Anesthesia: Choice

## 2019-03-25 ENCOUNTER — Other Ambulatory Visit: Payer: Self-pay | Admitting: Internal Medicine

## 2019-03-25 DIAGNOSIS — R636 Underweight: Secondary | ICD-10-CM

## 2019-03-25 DIAGNOSIS — E785 Hyperlipidemia, unspecified: Secondary | ICD-10-CM | POA: Insufficient documentation

## 2019-04-30 NOTE — Progress Notes (Signed)
Called pt to have tested repeated. Left VM. Awaiting call back so I can reorder for pick up of FIT test Kit.

## 2019-08-20 ENCOUNTER — Encounter: Payer: Self-pay | Admitting: Internal Medicine

## 2019-08-20 ENCOUNTER — Other Ambulatory Visit: Payer: Self-pay

## 2019-08-20 ENCOUNTER — Ambulatory Visit (INDEPENDENT_AMBULATORY_CARE_PROVIDER_SITE_OTHER): Payer: Medicare PPO | Admitting: Internal Medicine

## 2019-08-20 VITALS — BP 104/62 | HR 73 | Ht 71.0 in | Wt 125.0 lb

## 2019-08-20 DIAGNOSIS — M5442 Lumbago with sciatica, left side: Secondary | ICD-10-CM

## 2019-08-20 MED ORDER — PREDNISONE 10 MG PO TABS: 10.0000 mg | ORAL_TABLET | ORAL | 0 refills | Status: AC

## 2019-08-20 MED ORDER — BACLOFEN 10 MG PO TABS: 10.0000 mg | ORAL_TABLET | Freq: Every day | ORAL | 0 refills | Status: DC

## 2019-08-20 NOTE — Patient Instructions (Signed)
Do not take Advil or Aleve while taking prednisone.  Take prednisone with food.  Continue to use either heat or ice on the lower back.  Continue to use the topical rubs as needed but do not put heat on that area for several hours after application.  Take the Baclofen - muscle relaxant - at bedtime to help the muscles relax.  You can continue to use this as needed after you have finished the prednisone.

## 2019-08-20 NOTE — Progress Notes (Signed)
Date:  [id number]   Name:  Jessica Monroe   DOB:  [birth year]   MRN:  [id number]   Chief Complaint: Back Pain (X 2 months. Started April 14th. Worked in her Garden digging up and planting plants. Hurts from the waist down. )  Back Pain This is a chronic problem. Episode onset: 6 weeks ago. The problem has been gradually worsening since onset. The pain is present in the lumbar spine and sacro-iliac. The quality of the pain is described as aching. The pain radiates to the left knee. The pain is moderate. The pain is worse during the day. The symptoms are aggravated by standing and bending. Pertinent negatives include no bladder incontinence, bowel incontinence, chest pain, fever, headaches, perianal numbness, tingling, weakness or weight loss. She has tried heat and NSAIDs (topical rubs) for the symptoms. The treatment provided mild relief.    Lab Results  Component Value Date   CREATININE 0.76 02/03/2019   BUN 17 02/03/2019   NA 141 02/03/2019   K 5.2 02/03/2019   CL 101 02/03/2019   CO2 26 02/03/2019   Lab Results  Component Value Date   CHOL 200 (H) 02/03/2019   HDL 84 02/03/2019   LDLCALC 108 (H) 02/03/2019   TRIG 43 02/03/2019   CHOLHDL 2.4 02/03/2019   Lab Results  Component Value Date   TSH 2.610 02/03/2019   No results found for: HGBA1C Lab Results  Component Value Date   WBC 4.4 02/03/2019   HGB 13.8 02/03/2019   HCT 40.9 02/03/2019   MCV 93 02/03/2019   PLT 229 02/03/2019   Lab Results  Component Value Date   ALT 10 02/03/2019   AST 42 (H) 02/03/2019   ALKPHOS 62 02/03/2019   BILITOT 0.4 02/03/2019     Review of Systems  Constitutional: Negative for appetite change, fatigue, fever and weight loss.  Respiratory: Negative for chest tightness and shortness of breath.   Cardiovascular: Negative for chest pain, palpitations and leg swelling.  Gastrointestinal: Negative for bowel incontinence.  Genitourinary: Negative for bladder incontinence.   Musculoskeletal: Positive for back pain and gait problem. Negative for joint swelling.  Neurological: Negative for dizziness, tingling, weakness, light-headedness and headaches.    Patient Active Problem List   Diagnosis Date Noted  . Mild hyperlipidemia 03/25/2019  . Underweight 03/25/2019  . Biceps tendonitis on right 02/03/2019  . Vegetarian diet 02/03/2019  . Osteoporosis 04/13/2015  . IBS (irritable bowel syndrome) 03/10/2015  . Vitamin D deficiency 03/10/2015  . Menopause syndrome 03/10/2015  . Hx of nonmelanoma skin cancer 12/15/2014    Allergies  Allergen Reactions  . Sulfacetamide Sodium Hives  . Sulfasalazine Hives  . Sulfa Antibiotics Hives  . Tropicamide Other (See Comments)    Dilation lasts 4 days and she feels "terrible"  (drops used to dilate eyes)  . Latex Rash    Gloves, condoms    Past Surgical History:  Procedure Laterality Date  . DILATION AND CURETTAGE OF UTERUS    . TUBAL LIGATION      Social History   Tobacco Use  . Smoking status: Former Smoker    Quit date: 2012    Years since quitting: 9.4  . Smokeless tobacco: Never Used  Substance Use Topics  . Alcohol use: No    Alcohol/week: 0.0 standard drinks  . Drug use: No     Medication list has been reviewed and updated.  Current Meds  Medication Sig  . Ascorbic Acid (VITAMIN C PO) Take  by mouth.  . calcium-vitamin D (OSCAL WITH D) 500-200 MG-UNIT tablet Take 1 tablet by mouth.  . Docosahexaenoic Acid (DHA) 200 MG CAPS Take by mouth.  Marland Kitchen glucosamine-chondroitin 500-400 MG tablet Take 1 tablet by mouth 3 (three) times daily.  . Multiple Vitamin (MULTI-VITAMINS) TABS Take 1 tablet by mouth daily.    PHQ 2/9 Scores 08/20/2019 02/03/2019 01/22/2019 08/03/2017  PHQ - 2 Score 3 0 1 2  PHQ- 9 Score 3 5 - 4    BP Readings from Last 3 Encounters:  08/20/19 104/62  02/03/19 (!) 122/58  08/03/17 118/74    Physical Exam Vitals and nursing note reviewed.  Constitutional:      General: She  is not in acute distress.    Appearance: She is well-developed.  HENT:     Head: Normocephalic and atraumatic.  Cardiovascular:     Rate and Rhythm: Normal rate and regular rhythm.  Pulmonary:     Effort: Pulmonary effort is normal. No respiratory distress.  Abdominal:     General: Abdomen is flat.     Palpations: Abdomen is soft.  Musculoskeletal:     Cervical back: Normal range of motion.     Lumbar back: Tenderness present. No bony tenderness. Positive left straight leg raise test. Negative right straight leg raise test. Scoliosis present.  Lymphadenopathy:     Cervical: No cervical adenopathy.  Skin:    General: Skin is warm and dry.     Findings: No rash.  Neurological:     Mental Status: She is alert and oriented to person, place, and time.     Gait: Gait is intact.     Deep Tendon Reflexes:     Reflex Scores:      Bicep reflexes are 2+ on the right side and 2+ on the left side.      Patellar reflexes are 2+ on the right side and 2+ on the left side. Psychiatric:        Attention and Perception: Attention normal.        Mood and Affect: Mood normal.     Wt Readings from Last 3 Encounters:  08/20/19 125 lb (56.7 kg)  02/03/19 124 lb 9.6 oz (56.5 kg)  08/03/17 126 lb (57.2 kg)    BP 104/62   Pulse 73   Ht 5\' 11"  (1.803 m)   Wt 125 lb (56.7 kg)   SpO2 95%   BMI 17.43 kg/m   Assessment and Plan: 1. Midline low back pain with left-sided sciatica, unspecified chronicity Hold Advil/aleve while taking prednisone Continue heat or ice Continue topical rubs Call for Xrays and follow up if no improvement - predniSONE (DELTASONE) 10 MG tablet; Take 1 tablet (10 mg total) by mouth as directed for 6 days. Take 6,5,4,3,2,1 then stop  Dispense: 21 tablet; Refill: 0 - baclofen (LIORESAL) 10 MG tablet; Take 1 tablet (10 mg total) by mouth at bedtime.  Dispense: 30 each; Refill: 0   Partially dictated using Editor, commissioning. Any errors are unintentional.  Halina Maidens,  MD [facility]  08/20/2019

## 2019-08-21 ENCOUNTER — Telehealth: Payer: Self-pay | Admitting: Internal Medicine

## 2019-08-21 NOTE — Telephone Encounter (Signed)
Copied from [place] [phone]. Topic: General - Other >> Aug 21, 2019  8:36 AM Hinda Lenis D wrote: Reason for CRM:  PT has question about how to take this medicine and the side effects; predniSONE (DELTASONE) 10 MG tablet [phone] / please advise

## 2019-08-22 NOTE — Telephone Encounter (Signed)
Pt would like to speak back to chassidy. Pt said the pharmacy gave her different directions on taking prednison

## 2019-08-22 NOTE — Telephone Encounter (Signed)
Pt does have additional questions about the prednisone.  It was her understanding from the dr to take 2 pills, TID, with her meals the first day. The pharmacy told her to take all 6 at one time. Pt would like clarification. Cb:  [phone]

## 2019-08-22 NOTE — Telephone Encounter (Signed)
Called and left VM informing pt that prednisone has different side effects for different ppl. Told her its only temporary. Told her this medication is best to relieve inflammation and that is why Dr. B prescribed this for her. Told her to call back if she has any further questions.  CM

## 2019-08-22 NOTE — Telephone Encounter (Signed)
Tried to call pt back several times. Having phone issues in the office. Phone is ringing busy no matter what number I dial.  CM

## 2019-08-26 ENCOUNTER — Encounter: Payer: Self-pay | Admitting: Internal Medicine

## 2019-08-27 ENCOUNTER — Telehealth: Payer: Self-pay | Admitting: Internal Medicine

## 2019-08-27 ENCOUNTER — Other Ambulatory Visit: Payer: Self-pay

## 2019-08-27 DIAGNOSIS — M5442 Lumbago with sciatica, left side: Secondary | ICD-10-CM

## 2019-08-27 NOTE — Telephone Encounter (Signed)
Patient called back to let office know that she did receive the message.

## 2019-08-27 NOTE — Telephone Encounter (Signed)
Pt took her last Prednisone yesterday morning and wants to know if it is ok for her to take advil or aleave for her pain/ please advise   Pt also wanted to ask about getting a referral to a physiotherapist asap/ Pt plans to leave to go back to [place] in a week.

## 2019-08-27 NOTE — Telephone Encounter (Signed)
Called and could not reach pt. Left VM informing pt not to take aleve or Advil with prednisone. Told her she can take tylenol as needed.  Also, told her I placed referral for PT- [place] urgently. Told her I know she is going to [place] in a week so I placed the referral urgently to try and get patient in to PT before she goes out of town since her pain is no better.  CM

## 2019-09-03 ENCOUNTER — Other Ambulatory Visit: Payer: Self-pay

## 2019-09-03 ENCOUNTER — Ambulatory Visit: Payer: Medicare PPO | Attending: Internal Medicine | Admitting: Physical Therapy

## 2019-09-03 DIAGNOSIS — M545 Low back pain, unspecified: Secondary | ICD-10-CM

## 2019-09-03 DIAGNOSIS — M6281 Muscle weakness (generalized): Secondary | ICD-10-CM | POA: Diagnosis not present

## 2019-09-03 DIAGNOSIS — G8929 Other chronic pain: Secondary | ICD-10-CM | POA: Diagnosis not present

## 2019-09-03 DIAGNOSIS — R293 Abnormal posture: Secondary | ICD-10-CM

## 2019-09-03 NOTE — Therapy (Signed)
[facility] [address] [place], [place], [address] Phone: [phone]   Fax:  [phone]  Physical Therapy Evaluation  Patient Details  Name: Jessica Monroe MRN: [id number] Date of Birth: July 05, 1945 Referring Provider (PT): Halina Maidens   Encounter Date: 09/03/2019   PT End of Session - 09/03/19 1606    Visit Number 1    Number of Visits 1    Date for PT Re-Evaluation 09/03/19    PT Start Time 2725    PT Stop Time 3664    PT Time Calculation (min) 65 min    Activity Tolerance Patient tolerated treatment well    Behavior During Therapy [facility] for tasks assessed/performed           Past Medical History:  Diagnosis Date  . Cancer ([place])    skin ca  . Complication of anesthesia    rash after receiving IV med at oral surgeon's office over 25 yrs ago.  Not sure of med.    Past Surgical History:  Procedure Laterality Date  . DILATION AND CURETTAGE OF UTERUS    . TUBAL LIGATION      There were no vitals filed for this visit.        [facility] PT Assessment - 09/03/19 1604      Assessment   Medical Diagnosis Chronic low back pain    Referring Provider (PT) Halina Maidens    Onset Date/Surgical Date 07/04/19    Hand Dominance Right    Prior Therapy None      Balance Screen   Has the patient fallen in the past 6 months No           SUBJECTIVE Chief complaint:  Patient notes that she has had back issues since a traumatic fall down the steps, but this most recent concern began in April. Patient notes that she and her partner were doing a lot of yard work in preparation for the new season and in celebration of her partner's completion of cancer tx. Patient reports that she was doing roughly 4 hours of yard work with a lot of bending, twisting, and lifting which resulted in a strain sensation in her low back. Patient notes that she feels her emotional state has also had a significant impact on her pain. She states that  she was very anxious throughout her partner's cancer tx.    Pain: 0/10 Present, 0/10 Best, 4/10 Worst:  Aggravating factors: doing too much, anxiety  Easing factors: heat, arnica massage, hemp massage oil  24 hour pain behavior: stiff and painful after activity at the end of the day  Recent back trauma: No Prior history of back injury or pain: Yes Pain quality: pain quality: aching and dull Radiating pain: Yes ; to hip Numbness/Tingling: No Follow-up appointment with MD: No  Imaging: No   Hobbies: gardening, yoga, walking Goals: "I'd like a few stretches so I can better manage this." Red flags (bowel/bladder changes, saddle paresthesia, personal history of cancer, chills/fever, night sweats, unrelenting pain, first onset of insidious LBP <20 y/o) Negative    OBJECTIVE  Mental Status Patient is oriented to person, place and time.  Recent memory is intact.  Remote memory is intact.  Attention span and concentration are intact.  Expressive speech is intact.  Patient's fund of knowledge is within normal limits for educational level.  SENSATION: Grossly intact to light touch bilateral LEs as determined by testing dermatomes L2-S2 Proprioception and hot/cold testing deferred on this date  MUSCULOSKELETAL: Tremor: None Bulk: Normal Tone: Normal No visible step-off along spinal column  Posture Lumbar lordosis: WNL Iliac crest height: equal bilaterally Lumbar lateral shift: negative L lumbar curve present.   Gait Grossly WFL; pain with increased duration of gait.   Palpation No TTP of SIJ nor point tenderness over spinous processes.   Strength (out of 5) R/L 4/5 Hip flexion 5/5 Hip abduction (seated) 5/5 Hip adduction (seated) 5/5 Hip extension 5/5 Knee extension 5/5 Knee flexion 5/5 Ankle dorsiflexion 5/5 Ankle plantarflexion  *Indicates pain   AROM (degrees) R/L (all movements include overpressure unless otherwise stated) Lumbar forward flexion (65):  WNL Lumbar extension (30): WNL Lumbar lateral flexion (25): B: WNL Thoracic and Lumbar rotation (30 degrees):  B: WNL Hip IR (0-45): B: WNL Hip ER (0-45): B: WNL Hip Flexion (0-125): WNL Hip Abduction (0-40): B: WNL Hip extension (0-15): B: WNL *Indicates pain   Repeated Movements No centralization or peripheralization of symptoms with repeated lumbar extension or flexion.    Muscle Length Hamstrings: R: 0 degrees L: 0 degrees  Thomas: Negative Ober: Negative   Passive Accessory Intervertebral Motion (PAIVM) Deferred 2/2 to osteoporotic state.    SPECIAL TESTS Lumbar Radiculopathy and Discogenic: Centralization and Peripheralization (SN 92, -LR 0.12): Negative Slump (SN 83, -LR 0.32): R: Negative L: Negative SLR (SN 92, -LR 0.29): R: Negative L:  Negative Crossed SLR (SP 90): R: Negative L: Negative  Lumbar Spinal Stenosis: Lumbar quadrant (SN 70): R: Negative L: Negative  Hip: FABER (SN 81): R: Negative L: Negative FADIR (SN 94): R: Negative L: Negative  SIJ:  Stork Test (SN 88, -LR 0.18) : R: Negative L: Negative   Functional Tasks Squatting: WFL  Sit to stand: WNL    ASSESSMENT Patient is a 74 year old presenting to clinic with chief complaints of low back pain. Upon examination, patient demonstrates deficits in posture, muscle strength/endurance, spinal mobility as evidenced by L lumbar curve, pain with increased activity, and loss of height ~ 2 inches. Patient's responses on FOTO outcome measures (47) indicate moderate functional limitations/disability/distress. Patient's progress may be limited due to inability to participate in follow-up appointments; however, patient's support, motivation, and understanding of movement are advantageous. Patient was able to achieve gentle back stretches without pain/difficulty during today's evaluation and responded positively to educational, manual , and active interventions. Patient may benefit from continued skilled  therapeutic intervention to address deficits in posture, muscle strength/endurance, and spinal mobility upon return to the state, but does not require immediate follow-up at this time.   Patient educated on prognosis, POC, and provided with HEP including: pelvic tilts, knee to chest, hooklying trunk rotation, double knee to chest, happy baby. Patient articulated understanding and returned demonstration. Patient will benefit from further education in order to maximize compliance and understanding for long-term therapeutic gains.   TREATMENT  Manual Therapy: RLE, LAD for improved joint nutrition and decreased pain/stiffness, x4 min PT educated patient's partner on application of skill for maintenance while at other residence. Patient's partner demonstrated appropriate technique and articulated understanding.  Neuromuscular Re-education: Patient education on relationship between emotional and physical pain processing in the brain as well as strategies for pain coping including: graded activity; pleasing activity; identifying fear avoidance. Gentle lumbar stretches for improved spinal mobility and decreased pain/stiffness:  Double knee to chest  Single knee to chest  Happy baby  Pelvic tilts  Hooklying trunk rotation              Objective measurements completed on  examination: See above findings.                 PT Short Term Goals - 09/03/19 1812      PT SHORT TERM GOAL #1   Title Patient will demonstrate appropriate understanding of osteoporosis precautions and safe body mechanics for lifting and bending.    Baseline IE: Patient able to artciulate and return demonstration    Time 1    Period Days    Status Achieved    Target Date 09/03/19      PT SHORT TERM GOAL #2   Title Patient will articulate confidence and comfort with gentle lumbar series of stretches for improved spinal mobility and decreased pain.    Baseline IE: Patient able to perform all stretches  with good form and reports confidence at end of session.    Time 1    Period Days    Status Achieved    Target Date 09/03/19                     Plan - 09/03/19 1607    Clinical Impression Statement Patient is a 74 year old presenting to clinic with chief complaints of low back pain. Upon examination, patient demonstrates deficits in posture, muscle strength/endurance, spinal mobility as evidenced by L lumbar curve, pain with increased activity, and loss of height ~ 2 inches. Patient's responses on FOTO outcome measures (47) indicate moderate functional limitations/disability/distress. Patient's progress may be limited due to inability to participate in follow-up appointments; however, patient's support, motivation, and understanding of movement are advantageous. Patient was able to achieve gentle back stretches without pain/difficulty during today's evaluation and responded positively to educational, manual , and active interventions. Patient may benefit from continued skilled therapeutic intervention to address deficits in posture, muscle strength/endurance, and spinal mobility upon return to the state, but does not require immediate follow-up at this time.    Personal Factors and Comorbidities Age;Fitness;Comorbidity 3+    Comorbidities IBS, vit D deficiency, osteoporosis, underweight    Examination-Activity Limitations Transfers;Sleep;Lift;Squat;Bend;Locomotion Level;Stairs;Carry;Stand    Examination-Participation Restrictions Interpersonal Relationship;Community Activity;Driving;Yard Work;Laundry;Cleaning    Stability/Clinical Decision Making Evolving/Moderate complexity    Clinical Decision Making Moderate    Rehab Potential Fair    PT Frequency One time visit    PT Duration --    PT Treatment/Interventions Neuromuscular re-education;Patient/family education;Manual techniques;Therapeutic exercise;Therapeutic activities;Gait training;Stair training;ADLs/Self Care Home Management     PT Home Exercise Plan Gentle lumbar series    Consulted and Agree with Plan of Care Patient           Patient will benefit from skilled therapeutic intervention in order to improve the following deficits and impairments:  Abnormal gait, Decreased range of motion, Increased muscle spasms, Decreased endurance, Decreased activity tolerance, Pain, Postural dysfunction, Improper body mechanics, Decreased balance, Decreased strength  Visit Diagnosis: Chronic low back pain, unspecified back pain laterality, unspecified whether sciatica present  Abnormal posture  Muscle weakness (generalized)     Problem List Patient Active Problem List   Diagnosis Date Noted  . Mild hyperlipidemia 03/25/2019  . Underweight 03/25/2019  . Biceps tendonitis on right 02/03/2019  . Vegetarian diet 02/03/2019  . Osteoporosis 04/13/2015  . IBS (irritable bowel syndrome) 03/10/2015  . Vitamin D deficiency 03/10/2015  . Menopause syndrome 03/10/2015  . Hx of nonmelanoma skin cancer 12/15/2014   Myles Gip PT, DPT [id number] 09/03/2019, 6:32 PM  [place] [facility] [address] [place], [place], [address]  Phone: [phone]   Fax:  [phone]  Name: PALMA BUSTER MRN: [id number] Date of Birth: 07/29/1945

## 2020-01-26 ENCOUNTER — Telehealth: Payer: Self-pay | Admitting: Internal Medicine

## 2020-01-26 NOTE — Telephone Encounter (Signed)
Left message for patient to call back and schedule Medicare Annual Wellness Visit (AWV) either virtually/audio only or in office. Whichever the patients preference is.  Last AWV 01/22/19; please schedule at anytime with [facility] Health Advisor.  This should be a 40 minute visit.

## 2020-02-04 ENCOUNTER — Ambulatory Visit (INDEPENDENT_AMBULATORY_CARE_PROVIDER_SITE_OTHER): Payer: Medicare PPO

## 2020-02-04 DIAGNOSIS — Z0000 Encounter for general adult medical examination without abnormal findings: Secondary | ICD-10-CM | POA: Diagnosis not present

## 2020-02-04 NOTE — Progress Notes (Signed)
Subjective:   Jessica Monroe is a 74 y.o. female who presents for Medicare Annual (Subsequent) preventive examination.  Virtual Visit via Telephone Note  I connected with  Jessica Monroe on 70/35/00 at  8:00 AM EST by telephone and verified that I am speaking with the correct person using two identifiers.  Medicare Annual Wellness visit completed telephonically due to Covid-19 pandemic.   Location: Patient: home Provider: [facility]   I discussed the limitations, risks, security and privacy concerns of performing an evaluation and management service by telephone and the availability of in person appointments. The patient expressed understanding and agreed to proceed.  Unable to perform video visit due to video visit attempted and failed and/or patient does not have video capability.   Some vital signs may be absent or patient reported.   Jessica Marker, LPN    Review of Systems     Cardiac Risk Factors include: advanced age (>70men, >56 women)     Objective:    There were no vitals filed for this visit. There is no height or weight on file to calculate BMI.  Advanced Directives 02/04/2020 01/22/2019 08/02/2016 04/06/2015  Does Patient Have a Medical Advance Directive? No No No No  Would patient like information on creating a medical advance directive? Yes (MAU/Ambulatory/Procedural Areas - Information given) Yes (MAU/Ambulatory/Procedural Areas - Information given) - No - patient declined information    Current Medications (verified) Outpatient Encounter Medications as of 02/04/2020  Medication Sig  . Ascorbic Acid (VITAMIN C PO) Take by mouth.  . calcium-vitamin D (OSCAL WITH D) 500-200 MG-UNIT tablet Take 1 tablet by mouth.  . Docosahexaenoic Acid (DHA) 200 MG CAPS Take by mouth.  Marland Kitchen glucosamine-chondroitin 500-400 MG tablet Take 1 tablet by mouth 3 (three) times daily.  . Multiple Vitamin (MULTI-VITAMINS) TABS Take 1 tablet by mouth daily.  . [DISCONTINUED] baclofen (LIORESAL) 10  MG tablet Take 1 tablet (10 mg total) by mouth at bedtime.   No facility-administered encounter medications on file as of 02/04/2020.    Allergies (verified) Sulfacetamide sodium, Sulfasalazine, Sulfa antibiotics, Tropicamide, and Latex   History: Past Medical History:  Diagnosis Date  . Cancer ([place])    skin ca  . Complication of anesthesia    rash after receiving IV med at oral surgeon's office over 25 yrs ago.  Not sure of med.   Past Surgical History:  Procedure Laterality Date  . DILATION AND CURETTAGE OF UTERUS    . TUBAL LIGATION     Family History  Problem Relation Age of Onset  . Ovarian cancer Mother 55  . Bipolar disorder Father    Social History   Socioeconomic History  . Marital status: Single    Spouse name: Not on file  . Number of children: 4  . Years of education: Not on file  . Highest education level: Not on file  Occupational History  . Not on file  Tobacco Use  . Smoking status: Former Smoker    Quit date: 2012    Years since quitting: 9.8  . Smokeless tobacco: Never Used  Vaping Use  . Vaping Use: Never used  Substance and Sexual Activity  . Alcohol use: Yes    Alcohol/week: 1.0 standard drink    Types: 1 Shots of liquor per week  . Drug use: No  . Sexual activity: Not on file  Other Topics Concern  . Not on file  Social History Narrative  . Not on file   Social Determinants of  Health   Financial Resource Strain: Low Risk   . Difficulty of Paying Living Expenses: Not hard at all  Food Insecurity: No Food Insecurity  . Worried About Charity fundraiser in the Last Year: Never true  . Ran Out of Food in the Last Year: Never true  Transportation Needs: No Transportation Needs  . Lack of Transportation (Medical): No  . Lack of Transportation (Non-Medical): No  Physical Activity: Sufficiently Active  . Days of Exercise per Week: 7 days  . Minutes of Exercise per Session: 60 min  Stress: Stress Concern Present  . Feeling of Stress :  To some extent  Social Connections: Unknown  . Frequency of Communication with Friends and Family: Patient refused  . Frequency of Social Gatherings with Friends and Family: Patient refused  . Attends Religious Services: Patient refused  . Active Member of Clubs or Organizations: Patient refused  . Attends Archivist Meetings: Patient refused  . Marital Status: Living with partner    Tobacco Counseling Counseling given: Not Answered   Clinical Intake:  Pre-visit preparation completed: Yes  Pain : No/denies pain     Nutritional Risks: None Diabetes: No  How often do you need to have someone help you when you read instructions, pamphlets, or other written materials from your doctor or pharmacy?: 1 - Never    Interpreter Needed?: No  Information entered by :: Jessica Marker LPN   Activities of Daily Living In your present state of health, do you have any difficulty performing the following activities: 02/04/2020  Hearing? Y  Comment declines hearing aids  Vision? N  Difficulty concentrating or making decisions? N  Walking or climbing stairs? N  Dressing or bathing? N  Doing errands, shopping? N  Preparing Food and eating ? N  Using the Toilet? N  In the past six months, have you accidently leaked urine? N  Do you have problems with loss of bowel control? N  Managing your Medications? N  Managing your Finances? N  Housekeeping or managing your Housekeeping? N  Some recent data might be hidden    Patient Care Team: Glean Hess, MD as PCP - General (Family Medicine)  Indicate any recent Medical Services you may have received from other than [facility] providers in the past year (date may be approximate).     Assessment:   This is a routine wellness examination for Jessica Monroe.  Hearing/Vision screen  Hearing Screening   125Hz  250Hz  500Hz  1000Hz  2000Hz  3000Hz  4000Hz  6000Hz  8000Hz   Right ear:           Left ear:           Comments: Pt c/o mild hearing  difficulty with lower tones; declines hearing aids   Vision Screening Comments: Vision screenings done at [place] issues and exercise activities discussed: Current Exercise Habits: Home exercise routine, Type of exercise: Other - see comments;yoga (outside work), Time (Minutes): 60, Frequency (Times/Week): 7, Weekly Exercise (Minutes/Week): 420, Intensity: Moderate, Exercise limited by: None identified  Goals   None    Depression Screen PHQ 2/9 Scores 02/04/2020 08/20/2019 02/03/2019 01/22/2019 08/03/2017 08/02/2016 04/06/2015  PHQ - 2 Score 0 3 0 1 2 0 0  PHQ- 9 Score - 3 5 - 4 - -    Fall Risk Fall Risk  02/04/2020 08/20/2019 01/22/2019 01/22/2019 08/03/2017  Falls in the past year? 0 0 0 0 No  Number falls in past yr: 0 0 0 0 -  Injury with  Fall? 0 0 0 0 -  Risk for fall due to : No Fall Risks No Fall Risks - - -  Follow up Falls prevention discussed Falls evaluation completed Falls prevention discussed Falls prevention discussed -    Any stairs in or around the home? Yes  If so, are there any without handrails? No  Home free of loose throw rugs in walkways, pet beds, electrical cords, etc? Yes  Adequate lighting in your home to reduce risk of falls? Yes   ASSISTIVE DEVICES UTILIZED TO PREVENT FALLS:  Life alert? No  Use of a cane, walker or w/c? No  Grab bars in the bathroom? Yes  Shower chair or bench in shower? No  Elevated toilet seat or a handicapped toilet? No   TIMED UP AND GO:  Was the test performed? No . Telephonic visit.   Cognitive Function: Normal cognitive status assessed by direct observation by this Nurse Health Advisor.       6CIT Screen 08/02/2016  What Year? 0 points  What month? 0 points  What time? 0 points  Count back from 20 0 points  Months in reverse 2 points  Repeat phrase 0 points  Total Score 2    Immunizations Immunization History  Administered Date(s) Administered  . Hepatitis A, Adult 12/21/2016  . PFIZER  SARS-COV-2 Vaccination 05/19/2019, 06/08/2019    TDAP status: Due, Education has been provided regarding the importance of this vaccine. Advised may receive this vaccine at local pharmacy or Health Dept. Aware to provide a copy of the vaccination record if obtained from local pharmacy or Health Dept. Verbalized acceptance and understanding.   Flu Vaccine status: Declined, Education has been provided regarding the importance of this vaccine but patient still declined. Advised may receive this vaccine at local pharmacy or Health Dept. Aware to provide a copy of the vaccination record if obtained from local pharmacy or Health Dept. Verbalized acceptance and understanding.   Pneumococcal vaccine status: Declined,  Education has been provided regarding the importance of this vaccine but patient still declined. Advised may receive this vaccine at local pharmacy or Health Dept. Aware to provide a copy of the vaccination record if obtained from local pharmacy or Health Dept. Verbalized acceptance and understanding.    Covid-19 vaccine status: Completed vaccines  Qualifies for Shingles Vaccine? Yes   Zostavax completed No   Shingrix Completed?: No.    Education has been provided regarding the importance of this vaccine. Patient has been advised to call insurance company to determine out of pocket expense if they have not yet received this vaccine. Advised may also receive vaccine at local pharmacy or Health Dept. Verbalized acceptance and understanding.  Screening Tests Health Maintenance  Topic Date Due  . COLONOSCOPY  Never done  . MAMMOGRAM  04/13/2016  . INFLUENZA VACCINE  Never done  . DEXA SCAN  Completed  . COVID-19 Vaccine  Completed  . Hepatitis C Screening  Completed  . TETANUS/TDAP  Discontinued  . PNA vac Low Risk Adult  Discontinued    Health Maintenance  Health Maintenance Due  Topic Date Due  . COLONOSCOPY  Never done  . MAMMOGRAM  04/13/2016  . INFLUENZA VACCINE  Never done     Colorectal cancer screening: pt declines colonoscopy at this time but may be interested next year.   Mammogram screening status: pt declines  Bone density screening status: pt declined  Lung Cancer Screening: (Low Dose CT Chest recommended if Age 36-80 years, 30 pack-year currently smoking OR  have quit w/in 15years.) does not qualify.    Additional Screening:  Hepatitis C Screening: does qualify; Completed 04/06/15  Vision Screening: Recommended annual ophthalmology exams for early detection of glaucoma and other disorders of the eye. Is the patient up to date with their annual eye exam?  Yes  Who is the provider or what is the name of the office in which the patient attends annual eye exams? [facility] [place]  Dental Screening: Recommended annual dental exams for proper oral hygiene  Community Resource Referral / Chronic Care Management: CRR required this visit?  No   CCM required this visit?  No      Plan:     I have personally reviewed and noted the following in the patient's chart:   . Medical and social history . Use of alcohol, tobacco or illicit drugs  . Current medications and supplements . Functional ability and status . Nutritional status . Physical activity . Advanced directives . List of other physicians . Hospitalizations, surgeries, and ER visits in previous 12 months . Vitals . Screenings to include cognitive, depression, and falls . Referrals and appointments  In addition, I have reviewed and discussed with patient certain preventive protocols, quality metrics, and best practice recommendations. A written personalized care plan for preventive services as well as general preventive health recommendations were provided to patient.     Jessica Marker, LPN   [id number]   Nurse Notes: pt c/o femoral hernia in left groin area and trying to be careful when lifting and bending.

## 2020-02-04 NOTE — Patient Instructions (Signed)
Jessica Monroe , Thank you for taking time to come for your Medicare Wellness Visit. I appreciate your ongoing commitment to your health goals. Please review the following plan we discussed and let me know if I can assist you in the future.   Screening recommendations/referrals: Colonoscopy: postponed Mammogram: declined Bone Density: declined Recommended yearly ophthalmology/optometry visit for glaucoma screening and checkup Recommended yearly dental visit for hygiene and checkup  Vaccinations: Influenza vaccine: declined Pneumococcal vaccine: declined Tdap vaccine: due Shingles vaccine: Shingrix discussed. Please contact your pharmacy for coverage information.  Covid-19: done 05/19/19 & 06/08/19  Advanced directives: Advance directive discussed with you today. I have provided a copy for you to complete at home and have notarized. Once this is complete please bring a copy in to our office so we can scan it into your chart.  Conditions/risks identified: Keep up the great work!  Next appointment: Follow up in one year for your annual wellness visit    Preventive Care 65 Years and Older, Female Preventive care refers to lifestyle choices and visits with your health care provider that can promote health and wellness. What does preventive care include?  A yearly physical exam. This is also called an annual well check.  Dental exams once or twice a year.  Routine eye exams. Ask your health care provider how often you should have your eyes checked.  Personal lifestyle choices, including:  Daily care of your teeth and gums.  Regular physical activity.  Eating a healthy diet.  Avoiding tobacco and drug use.  Limiting alcohol use.  Practicing safe sex.  Taking low-dose aspirin every day.  Taking vitamin and mineral supplements as recommended by your health care provider. What happens during an annual well check? The services and screenings done by your health care provider during  your annual well check will depend on your age, overall health, lifestyle risk factors, and family history of disease. Counseling  Your health care provider may ask you questions about your:  Alcohol use.  Tobacco use.  Drug use.  Emotional well-being.  Home and relationship well-being.  Sexual activity.  Eating habits.  History of falls.  Memory and ability to understand (cognition).  Work and work Statistician.  Reproductive health. Screening  You may have the following tests or measurements:  Height, weight, and BMI.  Blood pressure.  Lipid and cholesterol levels. These may be checked every 5 years, or more frequently if you are over 1 years old.  Skin check.  Lung cancer screening. You may have this screening every year starting at age 58 if you have a 30-pack-year history of smoking and currently smoke or have quit within the past 15 years.  Fecal occult blood test (FOBT) of the stool. You may have this test every year starting at age 22.  Flexible sigmoidoscopy or colonoscopy. You may have a sigmoidoscopy every 5 years or a colonoscopy every 10 years starting at age 85.  Hepatitis C blood test.  Hepatitis B blood test.  Sexually transmitted disease (STD) testing.  Diabetes screening. This is done by checking your blood sugar (glucose) after you have not eaten for a while (fasting). You may have this done every 1-3 years.  Bone density scan. This is done to screen for osteoporosis. You may have this done starting at age 24.  Mammogram. This may be done every 1-2 years. Talk to your health care provider about how often you should have regular mammograms. Talk with your health care provider about your test results,  treatment options, and if necessary, the need for more tests. Vaccines  Your health care provider may recommend certain vaccines, such as:  Influenza vaccine. This is recommended every year.  Tetanus, diphtheria, and acellular pertussis (Tdap,  Td) vaccine. You may need a Td booster every 10 years.  Zoster vaccine. You may need this after age 1.  Pneumococcal 13-valent conjugate (PCV13) vaccine. One dose is recommended after age 61.  Pneumococcal polysaccharide (PPSV23) vaccine. One dose is recommended after age 65. Talk to your health care provider about which screenings and vaccines you need and how often you need them. This information is not intended to replace advice given to you by your health care provider. Make sure you discuss any questions you have with your health care provider. Document Released: 04/02/2015 Document Revised: 11/24/2015 Document Reviewed: 01/05/2015 Elsevier Interactive Patient Education  2017 [place] Prevention in the Home Falls can cause injuries. They can happen to people of all ages. There are many things you can do to make your home safe and to help prevent falls. What can I do on the outside of my home?  Regularly fix the edges of walkways and driveways and fix any cracks.  Remove anything that might make you trip as you walk through a door, such as a raised step or threshold.  Trim any bushes or trees on the path to your home.  Use bright outdoor lighting.  Clear any walking paths of anything that might make someone trip, such as rocks or tools.  Regularly check to see if handrails are loose or broken. Make sure that both sides of any steps have handrails.  Any raised decks and porches should have guardrails on the edges.  Have any leaves, snow, or ice cleared regularly.  Use sand or salt on walking paths during winter.  Clean up any spills in your garage right away. This includes oil or grease spills. What can I do in the bathroom?  Use night lights.  Install grab bars by the toilet and in the tub and shower. Do not use towel bars as grab bars.  Use non-skid mats or decals in the tub or shower.  If you need to sit down in the shower, use a plastic, non-slip  stool.  Keep the floor dry. Clean up any water that spills on the floor as soon as it happens.  Remove soap buildup in the tub or shower regularly.  Attach bath mats securely with double-sided non-slip rug tape.  Do not have throw rugs and other things on the floor that can make you trip. What can I do in the bedroom?  Use night lights.  Make sure that you have a light by your bed that is easy to reach.  Do not use any sheets or blankets that are too big for your bed. They should not hang down onto the floor.  Have a firm chair that has side arms. You can use this for support while you get dressed.  Do not have throw rugs and other things on the floor that can make you trip. What can I do in the kitchen?  Clean up any spills right away.  Avoid walking on wet floors.  Keep items that you use a lot in easy-to-reach places.  If you need to reach something above you, use a strong step stool that has a grab bar.  Keep electrical cords out of the way.  Do not use floor polish or wax that makes floors  slippery. If you must use wax, use non-skid floor wax.  Do not have throw rugs and other things on the floor that can make you trip. What can I do with my stairs?  Do not leave any items on the stairs.  Make sure that there are handrails on both sides of the stairs and use them. Fix handrails that are broken or loose. Make sure that handrails are as long as the stairways.  Check any carpeting to make sure that it is firmly attached to the stairs. Fix any carpet that is loose or worn.  Avoid having throw rugs at the top or bottom of the stairs. If you do have throw rugs, attach them to the floor with carpet tape.  Make sure that you have a light switch at the top of the stairs and the bottom of the stairs. If you do not have them, ask someone to add them for you. What else can I do to help prevent falls?  Wear shoes that:  Do not have high heels.  Have rubber bottoms.  Are  comfortable and fit you well.  Are closed at the toe. Do not wear sandals.  If you use a stepladder:  Make sure that it is fully opened. Do not climb a closed stepladder.  Make sure that both sides of the stepladder are locked into place.  Ask someone to hold it for you, if possible.  Clearly mark and make sure that you can see:  Any grab bars or handrails.  First and last steps.  Where the edge of each step is.  Use tools that help you move around (mobility aids) if they are needed. These include:  Canes.  Walkers.  Scooters.  Crutches.  Turn on the lights when you go into a dark area. Replace any light bulbs as soon as they burn out.  Set up your furniture so you have a clear path. Avoid moving your furniture around.  If any of your floors are uneven, fix them.  If there are any pets around you, be aware of where they are.  Review your medicines with your doctor. Some medicines can make you feel dizzy. This can increase your chance of falling. Ask your doctor what other things that you can do to help prevent falls. This information is not intended to replace advice given to you by your health care provider. Make sure you discuss any questions you have with your health care provider. Document Released: 12/31/2008 Document Revised: 08/12/2015 Document Reviewed: 04/10/2014 Elsevier Interactive Patient Education  2017 [employer].

## 2020-02-05 ENCOUNTER — Encounter: Payer: Medicare Other | Admitting: Internal Medicine

## 2020-04-06 ENCOUNTER — Encounter: Payer: Medicare PPO | Admitting: Internal Medicine

## 2020-05-05 ENCOUNTER — Ambulatory Visit (INDEPENDENT_AMBULATORY_CARE_PROVIDER_SITE_OTHER): Payer: Medicare PPO | Admitting: Internal Medicine

## 2020-05-05 ENCOUNTER — Other Ambulatory Visit: Payer: Self-pay

## 2020-05-05 ENCOUNTER — Encounter: Payer: Self-pay | Admitting: Internal Medicine

## 2020-05-05 VITALS — BP 128/72 | HR 71 | Temp 97.7°F | Ht 71.0 in | Wt 126.0 lb

## 2020-05-05 DIAGNOSIS — M5442 Lumbago with sciatica, left side: Secondary | ICD-10-CM | POA: Diagnosis not present

## 2020-05-05 DIAGNOSIS — Z0000 Encounter for general adult medical examination without abnormal findings: Secondary | ICD-10-CM | POA: Diagnosis not present

## 2020-05-05 DIAGNOSIS — Z1231 Encounter for screening mammogram for malignant neoplasm of breast: Secondary | ICD-10-CM | POA: Diagnosis not present

## 2020-05-05 DIAGNOSIS — M810 Age-related osteoporosis without current pathological fracture: Secondary | ICD-10-CM

## 2020-05-05 DIAGNOSIS — E559 Vitamin D deficiency, unspecified: Secondary | ICD-10-CM | POA: Diagnosis not present

## 2020-05-05 DIAGNOSIS — M4126 Other idiopathic scoliosis, lumbar region: Secondary | ICD-10-CM

## 2020-05-05 DIAGNOSIS — E785 Hyperlipidemia, unspecified: Secondary | ICD-10-CM | POA: Diagnosis not present

## 2020-05-05 MED ORDER — BACLOFEN 10 MG PO TABS: 10.0000 mg | ORAL_TABLET | Freq: Every evening | ORAL | 5 refills | Status: DC | PRN

## 2020-05-05 NOTE — Progress Notes (Signed)
Date:  0/34/7425   Name:  Jessica Monroe   DOB:  [birth year]   MRN:  [id number]   Chief Complaint: Annual Exam (Breast exam no pap)  DALLIS DARDEN is a 75 y.o. female who presents today for her Complete Annual Exam. She feels fairly well. She reports exercising yoga, hiking. She reports she is sleeping well. Breast complaints none.  Mammogram: 03/2015 DEXA: 03/2015 osteoporosis hip/osteopenia spine Colonoscopy: FIT 02/2019 sample not processed; colonoscopy cancelled by patient  Immunization History  Administered Date(s) Administered  . Hepatitis A, Adult 12/21/2016  . PFIZER(Purple Top)SARS-COV-2 Vaccination 05/19/2019, 06/08/2019    HPI  Lab Results  Component Value Date   CREATININE 0.76 02/03/2019   BUN 17 02/03/2019   NA 141 02/03/2019   K 5.2 02/03/2019   CL 101 02/03/2019   CO2 26 02/03/2019   Lab Results  Component Value Date   CHOL 200 (H) 02/03/2019   HDL 84 02/03/2019   LDLCALC 108 (H) 02/03/2019   TRIG 43 02/03/2019   CHOLHDL 2.4 02/03/2019   Lab Results  Component Value Date   TSH 2.610 02/03/2019   No results found for: HGBA1C Lab Results  Component Value Date   WBC 4.4 02/03/2019   HGB 13.8 02/03/2019   HCT 40.9 02/03/2019   MCV 93 02/03/2019   PLT 229 02/03/2019   Lab Results  Component Value Date   ALT 10 02/03/2019   AST 42 (H) 02/03/2019   ALKPHOS 62 02/03/2019   BILITOT 0.4 02/03/2019   Last vitamin D Lab Results  Component Value Date   VD25OH 31.6 08/03/2017     Review of Systems  Constitutional: Negative for chills, fatigue and fever.  HENT: Negative for congestion, hearing loss, tinnitus, trouble swallowing and voice change.   Eyes: Negative for visual disturbance.  Respiratory: Negative for cough, chest tightness, shortness of breath and wheezing.   Cardiovascular: Negative for chest pain, palpitations and leg swelling.  Gastrointestinal: Negative for abdominal pain, constipation, diarrhea and vomiting.  Endocrine:  Negative for polydipsia and polyuria.  Genitourinary: Negative for dysuria, frequency, genital sores and vaginal bleeding.  Musculoskeletal: Negative for arthralgias, gait problem and joint swelling.  Skin: Negative for color change and rash.  Neurological: Negative for dizziness, tremors, light-headedness and headaches.  Hematological: Negative for adenopathy. Does not bruise/bleed easily.  Psychiatric/Behavioral: Negative for dysphoric mood and sleep disturbance. The patient is not nervous/anxious.     Patient Active Problem List   Diagnosis Date Noted  . Mild hyperlipidemia 03/25/2019  . Underweight 03/25/2019  . Biceps tendonitis on right 02/03/2019  . Vegetarian diet 02/03/2019  . Osteoporosis 04/13/2015  . IBS (irritable bowel syndrome) 03/10/2015  . Vitamin D deficiency 03/10/2015  . Menopause syndrome 03/10/2015  . Hx of nonmelanoma skin cancer 12/15/2014    Allergies  Allergen Reactions  . Prednisone     Hallucinations   . Sulfacetamide Sodium Hives  . Sulfasalazine Hives  . Sulfa Antibiotics Hives  . Tropicamide Other (See Comments)    Dilation lasts 4 days and she feels "terrible"  (drops used to dilate eyes)  . Latex Rash    Gloves, condoms    Past Surgical History:  Procedure Laterality Date  . DILATION AND CURETTAGE OF UTERUS    . TUBAL LIGATION      Social History   Tobacco Use  . Smoking status: Former Smoker    Quit date: 2012    Years since quitting: 10.1  . Smokeless tobacco: Never Used  Vaping Use  . Vaping Use: Never used  Substance Use Topics  . Alcohol use: Yes    Alcohol/week: 1.0 standard drink    Types: 1 Shots of liquor per week  . Drug use: No     Medication list has been reviewed and updated.  Current Meds  Medication Sig  . Ascorbic Acid (VITAMIN C PO) Take by mouth.  . baclofen (LIORESAL) 10 MG tablet Take 10 mg by mouth 3 (three) times daily.  . calcium-vitamin D (OSCAL WITH D) 500-200 MG-UNIT tablet Take 1 tablet by  mouth.  Marland Kitchen glucosamine-chondroitin 500-400 MG tablet Take 1 tablet by mouth 3 (three) times daily.  . Multiple Vitamin (MULTI-VITAMINS) TABS Take 1 tablet by mouth daily.    PHQ 2/9 Scores 05/05/2020 02/04/2020 08/20/2019 02/03/2019  PHQ - 2 Score 0 0 3 0  PHQ- 9 Score 0 - 3 5    GAD 7 : Generalized Anxiety Score 05/05/2020 08/20/2019  Nervous, Anxious, on Edge 1 3  Control/stop worrying 1 2  Worry too much - different things 1 2  Trouble relaxing 0 0  Restless 0 0  Easily annoyed or irritable 0 0  Afraid - awful might happen 0 1  Total GAD 7 Score 3 8  Anxiety Difficulty - Not difficult at all    BP Readings from Last 3 Encounters:  05/05/20 128/72  08/20/19 104/62  02/03/19 (!) 122/58    Physical Exam Vitals and nursing note reviewed.  Constitutional:      General: She is not in acute distress.    Appearance: She is well-developed.  HENT:     Head: Normocephalic and atraumatic.     Right Ear: Tympanic membrane and ear canal normal.     Left Ear: Tympanic membrane and ear canal normal.     Nose:     Right Sinus: No maxillary sinus tenderness.     Left Sinus: No maxillary sinus tenderness.  Eyes:     General: No scleral icterus.       Right eye: No discharge.        Left eye: No discharge.     Conjunctiva/sclera: Conjunctivae normal.  Neck:     Thyroid: No thyromegaly.     Vascular: No carotid bruit.  Cardiovascular:     Rate and Rhythm: Normal rate and regular rhythm.     Pulses: Normal pulses.     Heart sounds: Normal heart sounds.  Pulmonary:     Effort: Pulmonary effort is normal. No respiratory distress.     Breath sounds: No wheezing.  Chest:  Breasts:     Right: No mass, nipple discharge, skin change or tenderness.     Left: No mass, nipple discharge, skin change or tenderness.    Abdominal:     General: Bowel sounds are normal.     Palpations: Abdomen is soft.     Tenderness: There is no abdominal tenderness.     Hernia: A hernia is present. Hernia is  present in the left inguinal area.  Musculoskeletal:     Cervical back: Normal range of motion. No erythema.     Lumbar back: Scoliosis present.     Right lower leg: No edema.     Left lower leg: No edema.  Lymphadenopathy:     Cervical: No cervical adenopathy.  Skin:    General: Skin is warm and dry.     Findings: No rash.  Neurological:     General: No focal deficit present.  Mental Status: She is alert and oriented to person, place, and time.     Cranial Nerves: No cranial nerve deficit.     Sensory: No sensory deficit.     Deep Tendon Reflexes: Reflexes are normal and symmetric.  Psychiatric:        Attention and Perception: Attention normal.        Mood and Affect: Mood normal.        Behavior: Behavior normal.     Wt Readings from Last 3 Encounters:  05/05/20 126 lb (57.2 kg)  08/20/19 125 lb (56.7 kg)  02/03/19 124 lb 9.6 oz (56.5 kg)    BP 128/72   Pulse 71   Temp 97.7 F (36.5 C) (Oral)   Ht 5\' 11"  (1.803 m)   Wt 126 lb (57.2 kg)   SpO2 100%   BMI 17.57 kg/m   Assessment and Plan: 1. Annual physical exam Pt declines all pneumonia vaccines, flu vaccines and shingles vaccines - CBC with Differential/Platelet - Comprehensive metabolic panel - TSH  2. Encounter for screening mammogram for breast cancer Pt declines mammogram She will continue self breast exams  3. Age related osteoporosis, unspecified pathological fracture presence Continues on calcium and vitamin D and regular exercise She declines repeat DEXA since she would not take medication  4. Mild hyperlipidemia Check labs - Lipid panel  5. Vitamin D deficiency Very normal levels - VITAMIN D 25 Hydroxy (Vit-D Deficiency, Fractures)  6. Other idiopathic scoliosis, lumbar region Continue Yoga  7. Midline low back pain with left-sided sciatica, unspecified chronicity - baclofen (LIORESAL) 10 MG tablet; Take 1 tablet (10 mg total) by mouth at bedtime as needed for muscle spasms.  Dispense:  30 each; Refill: 5   Partially dictated using Editor, commissioning. Any errors are unintentional.  Halina Maidens, MD [facility]  05/05/2020

## 2020-05-06 LAB — COMPREHENSIVE METABOLIC PANEL
ALT: 11 IU/L (ref 0–32)
AST: 47 IU/L — ABNORMAL HIGH (ref 0–40)
Albumin/Globulin Ratio: 2 (ref 1.2–2.2)
Albumin: 4.4 g/dL (ref 3.7–4.7)
Alkaline Phosphatase: 69 IU/L (ref 44–121)
BUN/Creatinine Ratio: 14 (ref 12–28)
BUN: 11 mg/dL (ref 8–27)
Bilirubin Total: 0.3 mg/dL (ref 0.0–1.2)
CO2: 23 mmol/L (ref 20–29)
Calcium: 9.4 mg/dL (ref 8.7–10.3)
Chloride: 102 mmol/L (ref 96–106)
Creatinine, Ser: 0.78 mg/dL (ref 0.57–1.00)
GFR calc Af Amer: 87 mL/min/{1.73_m2} (ref >59)
GFR calc non Af Amer: 75 mL/min/{1.73_m2} (ref >59)
Globulin, Total: 2.2 g/dL (ref 1.5–4.5)
Glucose: 101 mg/dL — ABNORMAL HIGH (ref 65–99)
Potassium: 4 mmol/L (ref 3.5–5.2)
Sodium: 142 mmol/L (ref 134–144)
Total Protein: 6.6 g/dL (ref 6.0–8.5)

## 2020-05-06 LAB — CBC WITH DIFFERENTIAL/PLATELET
Basophils Absolute: 0.1 10*3/uL (ref 0.0–0.2)
Basos: 2 %
EOS (ABSOLUTE): 0.2 10*3/uL (ref 0.0–0.4)
Eos: 5 %
Hematocrit: 40.6 % (ref 34.0–46.6)
Hemoglobin: 14.1 g/dL (ref 11.1–15.9)
Immature Grans (Abs): 0 10*3/uL (ref 0.0–0.1)
Immature Granulocytes: 0 %
Lymphocytes Absolute: 1 10*3/uL (ref 0.7–3.1)
Lymphs: 29 %
MCH: 31.7 pg (ref 26.6–33.0)
MCHC: 34.7 g/dL (ref 31.5–35.7)
MCV: 91 fL (ref 79–97)
Monocytes Absolute: 0.4 10*3/uL (ref 0.1–0.9)
Monocytes: 12 %
Neutrophils Absolute: 1.8 10*3/uL (ref 1.4–7.0)
Neutrophils: 52 %
Platelets: 232 10*3/uL (ref 150–450)
RBC: 4.45 x10E6/uL (ref 3.77–5.28)
RDW: 11.7 % (ref 11.7–15.4)
WBC: 3.5 10*3/uL (ref 3.4–10.8)

## 2020-05-06 LAB — LIPID PANEL
Chol/HDL Ratio: 2.7 ratio (ref 0.0–4.4)
Cholesterol, Total: 184 mg/dL (ref 100–199)
HDL: 69 mg/dL (ref >39)
LDL Chol Calc (NIH): 106 mg/dL — ABNORMAL HIGH (ref 0–99)
Triglycerides: 45 mg/dL (ref 0–149)
VLDL Cholesterol Cal: 9 mg/dL (ref 5–40)

## 2020-05-06 LAB — VITAMIN D 25 HYDROXY (VIT D DEFICIENCY, FRACTURES): Vit D, 25-Hydroxy: 37.4 ng/mL (ref 30.0–100.0)

## 2020-05-06 LAB — TSH: TSH: 3.3 u[IU]/mL (ref 0.450–4.500)

## 2020-05-12 ENCOUNTER — Telehealth: Payer: Self-pay

## 2020-05-12 NOTE — Telephone Encounter (Signed)
Called pt left VM that pts spine condition is called osteoporosis.  KP

## 2020-05-12 NOTE — Telephone Encounter (Signed)
Copied from [place] [id number]. Topic: General - Other >> May 12, 2020 11:19 AM Lennox Solders wrote: Reason for CRM: Pt just got off the phone with Randa Evens and she forgot to ask her what does dr berglund call her spinal condition

## 2020-05-24 ENCOUNTER — Other Ambulatory Visit: Payer: Self-pay

## 2020-05-24 DIAGNOSIS — Z13820 Encounter for screening for osteoporosis: Secondary | ICD-10-CM

## 2020-05-24 NOTE — Telephone Encounter (Signed)
Will route result note to [facility] Nurse Triage for follow up when patient returns call to clinic. Nurse may give results to patient if they return call. CRM created for this message.   KP

## 2020-05-24 NOTE — Telephone Encounter (Signed)
Pt would like to know if Dr Carolin Coy would recommend her to have another bone density.  Pt did not realize it had been 5 yrs since her last one.  Pt wants to know if she could with stand the machine that this is done in?  Have they changed the machine this was done in?  She remember lying on iron bars that hurt her back.

## 2020-05-24 NOTE — Telephone Encounter (Signed)
Called pt left VM that a order was placed for bone density. Also left phone number for her to call and schedule an appt [phone].  KP

## 2020-05-24 NOTE — Telephone Encounter (Signed)
Pt does not want to back to have bone density test at [facility] location. Pt would like to go to breast center in [place] or another facility in [facility]

## 2020-05-31 DIAGNOSIS — L814 Other melanin hyperpigmentation: Secondary | ICD-10-CM | POA: Diagnosis not present

## 2020-05-31 DIAGNOSIS — Z85828 Personal history of other malignant neoplasm of skin: Secondary | ICD-10-CM | POA: Diagnosis not present

## 2020-05-31 DIAGNOSIS — C44311 Basal cell carcinoma of skin of nose: Secondary | ICD-10-CM | POA: Diagnosis not present

## 2020-05-31 DIAGNOSIS — L578 Other skin changes due to chronic exposure to nonionizing radiation: Secondary | ICD-10-CM | POA: Diagnosis not present

## 2020-05-31 DIAGNOSIS — L570 Actinic keratosis: Secondary | ICD-10-CM | POA: Diagnosis not present

## 2020-05-31 DIAGNOSIS — D492 Neoplasm of unspecified behavior of bone, soft tissue, and skin: Secondary | ICD-10-CM | POA: Diagnosis not present

## 2020-06-08 ENCOUNTER — Other Ambulatory Visit: Payer: Medicare PPO

## 2020-07-17 DIAGNOSIS — Z634 Disappearance and death of family member: Secondary | ICD-10-CM | POA: Diagnosis not present

## 2020-07-17 DIAGNOSIS — F410 Panic disorder [episodic paroxysmal anxiety] without agoraphobia: Secondary | ICD-10-CM | POA: Diagnosis not present

## 2020-07-17 DIAGNOSIS — F32A Depression, unspecified: Secondary | ICD-10-CM | POA: Diagnosis not present

## 2020-07-17 DIAGNOSIS — F99 Mental disorder, not otherwise specified: Secondary | ICD-10-CM | POA: Diagnosis not present

## 2020-07-28 DIAGNOSIS — L578 Other skin changes due to chronic exposure to nonionizing radiation: Secondary | ICD-10-CM | POA: Diagnosis not present

## 2020-07-28 DIAGNOSIS — Z85828 Personal history of other malignant neoplasm of skin: Secondary | ICD-10-CM | POA: Diagnosis not present

## 2020-07-28 DIAGNOSIS — C44311 Basal cell carcinoma of skin of nose: Secondary | ICD-10-CM | POA: Diagnosis not present

## 2020-07-28 DIAGNOSIS — L570 Actinic keratosis: Secondary | ICD-10-CM | POA: Diagnosis not present

## 2020-07-28 DIAGNOSIS — L814 Other melanin hyperpigmentation: Secondary | ICD-10-CM | POA: Diagnosis not present

## 2020-11-10 ENCOUNTER — Telehealth: Payer: Self-pay

## 2020-11-10 NOTE — Telephone Encounter (Signed)
Called patient to remind her to sched bone density. She said she cannot commit to schedule at this time because she is new [place] with her fiance and does not know when she will come back to [place].

## 2020-11-25 ENCOUNTER — Telehealth: Payer: Self-pay

## 2020-11-25 NOTE — Telephone Encounter (Signed)
Called pt left VM as a reminder for her to call ad schedule her bone density.  KP

## 2021-02-07 ENCOUNTER — Ambulatory Visit (INDEPENDENT_AMBULATORY_CARE_PROVIDER_SITE_OTHER): Payer: Medicare PPO

## 2021-02-07 DIAGNOSIS — Z0000 Encounter for general adult medical examination without abnormal findings: Secondary | ICD-10-CM

## 2021-02-07 NOTE — Patient Instructions (Addendum)
Jessica Monroe , Thank you for taking time to come for your Medicare Wellness Visit. I appreciate your ongoing commitment to your health goals. Please review the following plan we discussed and let me know if I can assist you in the future.   Screening recommendations/referrals: Colonoscopy: due for screening colonoscopy Mammogram: done 04/13/15 Bone Density: done 04/13/15 Recommended yearly ophthalmology/optometry visit for glaucoma screening and checkup Recommended yearly dental visit for hygiene and checkup  Vaccinations: Influenza vaccine: declined Pneumococcal vaccine: done 12/26/11 Tdap vaccine: due Shingles vaccine: Shingrix discussed. Please contact your pharmacy for coverage information.  Covid-19:done 05/19/19 & 06/08/19; please bring a copy of your vaccine record to your next appointment for booster information   Advanced directives: Advance directive discussed with you today. I have provided a copy for you to complete at home and have notarized. Once this is complete please bring a copy in to our office so we can scan it into your chart.   Conditions/risks identified: Keep up the great work!  Next appointment: Follow up in one year for your annual wellness visit    Preventive Care 65 Years and Older, Female Preventive care refers to lifestyle choices and visits with your health care provider that can promote health and wellness. What does preventive care include? A yearly physical exam. This is also called an annual well check. Dental exams once or twice a year. Routine eye exams. Ask your health care provider how often you should have your eyes checked. Personal lifestyle choices, including: Daily care of your teeth and gums. Regular physical activity. Eating a healthy diet. Avoiding tobacco and drug use. Limiting alcohol use. Practicing safe sex. Taking low-dose aspirin every day. Taking vitamin and mineral supplements as recommended by your health care provider. What  happens during an annual well check? The services and screenings done by your health care provider during your annual well check will depend on your age, overall health, lifestyle risk factors, and family history of disease. Counseling  Your health care provider may ask you questions about your: Alcohol use. Tobacco use. Drug use. Emotional well-being. Home and relationship well-being. Sexual activity. Eating habits. History of falls. Memory and ability to understand (cognition). Work and work Statistician. Reproductive health. Screening  You may have the following tests or measurements: Height, weight, and BMI. Blood pressure. Lipid and cholesterol levels. These may be checked every 5 years, or more frequently if you are over 94 years old. Skin check. Lung cancer screening. You may have this screening every year starting at age 38 if you have a 30-pack-year history of smoking and currently smoke or have quit within the past 15 years. Fecal occult blood test (FOBT) of the stool. You may have this test every year starting at age 35. Flexible sigmoidoscopy or colonoscopy. You may have a sigmoidoscopy every 5 years or a colonoscopy every 10 years starting at age 65. Hepatitis C blood test. Hepatitis B blood test. Sexually transmitted disease (STD) testing. Diabetes screening. This is done by checking your blood sugar (glucose) after you have not eaten for a while (fasting). You may have this done every 1-3 years. Bone density scan. This is done to screen for osteoporosis. You may have this done starting at age 64. Mammogram. This may be done every 1-2 years. Talk to your health care provider about how often you should have regular mammograms. Talk with your health care provider about your test results, treatment options, and if necessary, the need for more tests. Vaccines  Your health  care provider may recommend certain vaccines, such as: Influenza vaccine. This is recommended every  year. Tetanus, diphtheria, and acellular pertussis (Tdap, Td) vaccine. You may need a Td booster every 10 years. Zoster vaccine. You may need this after age 107. Pneumococcal 13-valent conjugate (PCV13) vaccine. One dose is recommended after age 39. Pneumococcal polysaccharide (PPSV23) vaccine. One dose is recommended after age 46. Talk to your health care provider about which screenings and vaccines you need and how often you need them. This information is not intended to replace advice given to you by your health care provider. Make sure you discuss any questions you have with your health care provider. Document Released: 04/02/2015 Document Revised: 11/24/2015 Document Reviewed: 01/05/2015 Elsevier Interactive Patient Education  2017 [place] Prevention in the Home Falls can cause injuries. They can happen to people of all ages. There are many things you can do to make your home safe and to help prevent falls. What can I do on the outside of my home? Regularly fix the edges of walkways and driveways and fix any cracks. Remove anything that might make you trip as you walk through a door, such as a raised step or threshold. Trim any bushes or trees on the path to your home. Use bright outdoor lighting. Clear any walking paths of anything that might make someone trip, such as rocks or tools. Regularly check to see if handrails are loose or broken. Make sure that both sides of any steps have handrails. Any raised decks and porches should have guardrails on the edges. Have any leaves, snow, or ice cleared regularly. Use sand or salt on walking paths during winter. Clean up any spills in your garage right away. This includes oil or grease spills. What can I do in the bathroom? Use night lights. Install grab bars by the toilet and in the tub and shower. Do not use towel bars as grab bars. Use non-skid mats or decals in the tub or shower. If you need to sit down in the shower, use a  plastic, non-slip stool. Keep the floor dry. Clean up any water that spills on the floor as soon as it happens. Remove soap buildup in the tub or shower regularly. Attach bath mats securely with double-sided non-slip rug tape. Do not have throw rugs and other things on the floor that can make you trip. What can I do in the bedroom? Use night lights. Make sure that you have a light by your bed that is easy to reach. Do not use any sheets or blankets that are too big for your bed. They should not hang down onto the floor. Have a firm chair that has side arms. You can use this for support while you get dressed. Do not have throw rugs and other things on the floor that can make you trip. What can I do in the kitchen? Clean up any spills right away. Avoid walking on wet floors. Keep items that you use a lot in easy-to-reach places. If you need to reach something above you, use a strong step stool that has a grab bar. Keep electrical cords out of the way. Do not use floor polish or wax that makes floors slippery. If you must use wax, use non-skid floor wax. Do not have throw rugs and other things on the floor that can make you trip. What can I do with my stairs? Do not leave any items on the stairs. Make sure that there are handrails on  both sides of the stairs and use them. Fix handrails that are broken or loose. Make sure that handrails are as long as the stairways. Check any carpeting to make sure that it is firmly attached to the stairs. Fix any carpet that is loose or worn. Avoid having throw rugs at the top or bottom of the stairs. If you do have throw rugs, attach them to the floor with carpet tape. Make sure that you have a light switch at the top of the stairs and the bottom of the stairs. If you do not have them, ask someone to add them for you. What else can I do to help prevent falls? Wear shoes that: Do not have high heels. Have rubber bottoms. Are comfortable and fit you  well. Are closed at the toe. Do not wear sandals. If you use a stepladder: Make sure that it is fully opened. Do not climb a closed stepladder. Make sure that both sides of the stepladder are locked into place. Ask someone to hold it for you, if possible. Clearly mark and make sure that you can see: Any grab bars or handrails. First and last steps. Where the edge of each step is. Use tools that help you move around (mobility aids) if they are needed. These include: Canes. Walkers. Scooters. Crutches. Turn on the lights when you go into a dark area. Replace any light bulbs as soon as they burn out. Set up your furniture so you have a clear path. Avoid moving your furniture around. If any of your floors are uneven, fix them. If there are any pets around you, be aware of where they are. Review your medicines with your doctor. Some medicines can make you feel dizzy. This can increase your chance of falling. Ask your doctor what other things that you can do to help prevent falls. This information is not intended to replace advice given to you by your health care provider. Make sure you discuss any questions you have with your health care provider. Document Released: 12/31/2008 Document Revised: 08/12/2015 Document Reviewed: 04/10/2014 Elsevier Interactive Patient Education  2017 [employer].

## 2021-02-07 NOTE — Progress Notes (Signed)
Subjective:   Jessica Monroe is a 75 y.o. female who presents for Medicare Annual (Subsequent) preventive examination.  Virtual Visit via Telephone Note  I connected with  [name] on 92/01/00 at  8:00 AM EST by telephone and verified that I am speaking with the correct person using two identifiers.  Location: Patient: home Provider: [facility] Persons participating in the virtual visit: [place]   I discussed the limitations, risks, security and privacy concerns of performing an evaluation and management service by telephone and the availability of in person appointments. The patient expressed understanding and agreed to proceed.  Interactive audio and video telecommunications were attempted between this nurse and patient, however failed, due to patient having technical difficulties OR patient did not have access to video capability.  We continued and completed visit with audio only.  Some vital signs may be absent or patient reported.   [name], LPN   Review of Systems     Cardiac Risk Factors include: advanced age (>49men, >81 women)     Objective:    There were no vitals filed for this visit. There is no height or weight on file to calculate BMI.  Advanced Directives 02/07/2021 02/04/2020 01/22/2019 08/02/2016 04/06/2015  Does Patient Have a Medical Advance Directive? No No No No No  Would patient like information on creating a medical advance directive? Yes (MAU/Ambulatory/Procedural Areas - Information given) Yes (MAU/Ambulatory/Procedural Areas - Information given) Yes (MAU/Ambulatory/Procedural Areas - Information given) - No - patient declined information    Current Medications (verified) Outpatient Encounter Medications as of 02/07/2021  Medication Sig   Ascorbic Acid (VITAMIN C PO) Take by mouth.   calcium-vitamin D (OSCAL WITH D) 500-200 MG-UNIT tablet Take 1 tablet by mouth.   Multiple Vitamin (MULTI-VITAMINS) TABS Take 1 tablet by mouth  daily.   baclofen (LIORESAL) 10 MG tablet Take 1 tablet (10 mg total) by mouth at bedtime as needed for muscle spasms. (Patient not taking: Reported on 02/07/2021)   glucosamine-chondroitin 500-400 MG tablet Take 1 tablet by mouth 3 (three) times daily. (Patient not taking: Reported on 02/07/2021)   No facility-administered encounter medications on file as of 02/07/2021.    Allergies (verified) Prednisone, Sulfacetamide sodium, Sulfasalazine, Sulfa antibiotics, Tropicamide, and Latex   History: Past Medical History:  Diagnosis Date   Anxiety    Cancer ([place])    skin ca   Complication of anesthesia    rash after receiving IV med at oral surgeon's office over 25 yrs ago.  Not sure of med.   Past Surgical History:  Procedure Laterality Date   DILATION AND CURETTAGE OF UTERUS     TUBAL LIGATION     Family History  Problem Relation Age of Onset   Ovarian cancer Mother 60   Bipolar disorder Father    Social History   Socioeconomic History   Marital status: Significant Other    Spouse name: Not on file   Number of children: 4   Years of education: Not on file   Highest education level: Not on file  Occupational History   Not on file  Tobacco Use   Smoking status: Former    Types: Cigarettes    Quit date: 2012    Years since quitting: 10.8   Smokeless tobacco: Never  Vaping Use   Vaping Use: Never used  Substance and Sexual Activity   Alcohol use: Yes    Alcohol/week: 1.0 standard drink    Types: 1 Shots of liquor per  week   Drug use: No   Sexual activity: Not on file  Other Topics Concern   Not on file  Social History Narrative   Not on file   Social Determinants of Health   Financial Resource Strain: Low Risk    Difficulty of Paying Living Expenses: Not hard at all  Food Insecurity: No Food Insecurity   Worried About Running Out of Food in the Last Year: Never true   [place] in the Last Year: Never true  Transportation Needs: No Transportation Needs    Lack of Transportation (Medical): No   Lack of Transportation (Non-Medical): No  Physical Activity: Sufficiently Active   Days of Exercise per Week: 7 days   Minutes of Exercise per Session: 60 min  Stress: Stress Concern Present   Feeling of Stress : Rather much  Social Connections: Moderately Integrated   Frequency of Communication with Friends and Family: More than three times a week   Frequency of Social Gatherings with Friends and Family: Never   Attends Religious Services: More than 4 times per year   Active Member of [employer] or Organizations: No   Attends Music therapist: Never   Marital Status: Living with partner    Tobacco Counseling Counseling given: Not Answered   Clinical Intake:  Pre-visit preparation completed: Yes  Pain : No/denies pain     Nutritional Risks: None Diabetes: No  How often do you need to have someone help you when you read instructions, pamphlets, or other written materials from your doctor or pharmacy?: 1 - Never    Interpreter Needed?: No  Information entered by :: [name] LPN   Activities of Daily Living In your present state of health, do you have any difficulty performing the following activities: 02/07/2021  Hearing? Y  Comment declines hearing aids  Vision? N  Difficulty concentrating or making decisions? N  Walking or climbing stairs? N  Dressing or bathing? N  Doing errands, shopping? N  Preparing Food and eating ? N  Using the Toilet? N  In the past six months, have you accidently leaked urine? N  Do you have problems with loss of bowel control? N  Managing your Medications? N  Managing your Finances? N  Housekeeping or managing your Housekeeping? N  Some recent data might be hidden    Patient Care Team: Glean Hess, MD as PCP - General (Family Medicine)  Indicate any recent Medical Services you may have received from other than [facility] providers in the past year (date may be approximate).      Assessment:   This is a routine wellness examination for Jessica Monroe.  Hearing/Vision screen Hearing Screening - Comments:: Pt c/o mild hearing difficulty with lower tones; declines hearing aids  Vision Screening - Comments:: [facility] screenings done at [facility]; Due for eye exam  Dietary issues and exercise activities discussed: Current Exercise Habits: Home exercise routine, Type of exercise: walking, Time (Minutes): 60, Frequency (Times/Week): 7, Weekly Exercise (Minutes/Week): 420, Intensity: Moderate, Exercise limited by: None identified   Goals Addressed   None    Depression Screen PHQ 2/9 Scores 02/07/2021 05/05/2020 02/04/2020 08/20/2019 02/03/2019 01/22/2019 08/03/2017  PHQ - 2 Score 2 0 0 3 0 1 2  PHQ- 9 Score 8 0 - 3 5 - 4    Fall Risk Fall Risk  02/07/2021 05/05/2020 02/04/2020 08/20/2019 01/22/2019  Falls in the past year? 0 0 0 0 0  Number falls in past yr: 0 - 0  0 0  Injury with Fall? 0 - 0 0 0  Risk for fall due to : No Fall Risks - No Fall Risks No Fall Risks -  Follow up Falls prevention discussed Falls evaluation completed Falls prevention discussed Falls evaluation completed Falls prevention discussed    FALL RISK PREVENTION PERTAINING TO THE HOME:  Any stairs in or around the home? Yes  If so, are there any without handrails? No  Home free of loose throw rugs in walkways, pet beds, electrical cords, etc? Yes  Adequate lighting in your home to reduce risk of falls? Yes   ASSISTIVE DEVICES UTILIZED TO PREVENT FALLS:  Life alert? No  Use of a cane, walker or w/c? No  Grab bars in the bathroom? Yes  Shower chair or bench in shower? No  Elevated toilet seat or a handicapped toilet? No   TIMED UP AND GO:  Was the test performed? No . Telephonic visit  Cognitive Function: Normal cognitive status assessed by direct observation by this Nurse Health Advisor. No abnormalities found.       6CIT Screen 08/02/2016  What Year? 0 points  What month? 0 points   What time? 0 points  Count back from 20 0 points  Months in reverse 2 points  Repeat phrase 0 points  Total Score 2    Immunizations Immunization History  Administered Date(s) Administered   H1N1 04/10/2008   Hepatitis A, Adult 12/21/2016   Influenza, Seasonal, Injecte, Preservative Fre 12/08/2008, 01/10/2010, 12/23/2010, 12/26/2011, 01/30/2012   PFIZER(Purple Top)SARS-COV-2 Vaccination 05/19/2019, 06/08/2019   Pneumococcal Polysaccharide-23 12/26/2011   Tdap 11/11/2010   Zoster, Live 12/23/2010    TDAP status: Due, Education has been provided regarding the importance of this vaccine. Advised may receive this vaccine at local pharmacy or Health Dept. Aware to provide a copy of the vaccination record if obtained from local pharmacy or Health Dept. Verbalized acceptance and understanding.  Flu Vaccine status: Declined, Education has been provided regarding the importance of this vaccine but patient still declined. Advised may receive this vaccine at local pharmacy or Health Dept. Aware to provide a copy of the vaccination record if obtained from local pharmacy or Health Dept. Verbalized acceptance and understanding.  Pneumococcal vaccine status: Up to date  Covid-19 vaccine status: Completed vaccines  Qualifies for Shingles Vaccine? Yes   Zostavax completed Yes   Shingrix Completed?: No.    Education has been provided regarding the importance of this vaccine. Patient has been advised to call insurance company to determine out of pocket expense if they have not yet received this vaccine. Advised may also receive vaccine at local pharmacy or Health Dept. Verbalized acceptance and understanding.  Screening Tests Health Maintenance  Topic Date Due   Pneumonia Vaccine 5+ Years old (2 - PCV) 12/25/2012   COVID-19 Vaccine (3 - Pfizer risk series) 07/06/2019   Zoster Vaccines- Shingrix (1 of 2) 05/10/2021 (Originally 03/07/1965)   INFLUENZA VACCINE  06/17/2021 (Originally 10/18/2020)    MAMMOGRAM  02/07/2022 (Originally 04/13/2016)   COLONOSCOPY (Pts 45-9yrs Insurance coverage will need to be confirmed)  02/07/2022 (Originally 03/08/1991)   DEXA SCAN  Completed   Hepatitis C Screening  Completed   HPV VACCINES  Aged Out   TETANUS/TDAP  Discontinued    Health Maintenance  Health Maintenance Due  Topic Date Due   Pneumonia Vaccine 5+ Years old (2 - PCV) 12/25/2012   COVID-19 Vaccine (3 - Pfizer risk series) 07/06/2019   Colorectal cancer screening: pt due for screening colonoscopy;  declines referral at this time  Mammogram status: Completed 04/13/15. Repeat every year. Pt declines repeat screening at this time  Bone Density status: Completed 04/13/15. Results reflect: Bone density results: OSTEOPOROSIS. Repeat every 2 years.  Pt declines repeat screening at this time.   Lung Cancer Screening: (Low Dose CT Chest recommended if Age 79-80 years, 30 pack-year currently smoking OR have quit w/in 15years.) does not qualify.   Additional Screening:  Hepatitis C Screening: does qualify; Completed 04/06/15  Vision Screening: Recommended annual ophthalmology exams for early detection of glaucoma and other disorders of the eye. Is the patient up to date with their annual eye exam?  No  Who is the provider or what is the name of the office in which the patient attends annual eye exams? [place] Screening: Recommended annual dental exams for proper oral hygiene  Community Resource Referral / Chronic Care Management: CRR required this visit?  No   CCM required this visit?  No      Plan:     I have personally reviewed and noted the following in the patient's chart:   Medical and social history Use of alcohol, tobacco or illicit drugs  Current medications and supplements including opioid prescriptions.  Functional ability and status Nutritional status Physical activity Advanced directives List of other physicians Hospitalizations, surgeries, and  ER visits in previous 12 months Vitals Screenings to include cognitive, depression, and falls Referrals and appointments  In addition, I have reviewed and discussed with patient certain preventive protocols, quality metrics, and best practice recommendations. A written personalized care plan for preventive services as well as general preventive health recommendations were provided to patient.     [name], LPN   [id number]   Nurse Notes: pt states she she is now living permanently in [place] with her [employer]. Pt states stress & anxiety related to the move and letting go of their home in [place]. PHQ9 score of 8 but feels like she will feel better once she feels more grounded here.

## 2021-03-07 ENCOUNTER — Ambulatory Visit
Admission: EM | Admit: 2021-03-07 | Discharge: 2021-03-07 | Disposition: A | Discharge status: Home / Self Care | Payer: Medicare PPO | Attending: Internal Medicine | Admitting: Internal Medicine

## 2021-03-07 ENCOUNTER — Ambulatory Visit (INDEPENDENT_AMBULATORY_CARE_PROVIDER_SITE_OTHER): Payer: Medicare PPO

## 2021-03-07 ENCOUNTER — Other Ambulatory Visit: Payer: Self-pay

## 2021-03-07 DIAGNOSIS — Z20822 Contact with and (suspected) exposure to covid-19: Secondary | ICD-10-CM | POA: Diagnosis not present

## 2021-03-07 DIAGNOSIS — R051 Acute cough: Secondary | ICD-10-CM | POA: Diagnosis not present

## 2021-03-07 DIAGNOSIS — R059 Cough, unspecified: Secondary | ICD-10-CM

## 2021-03-07 DIAGNOSIS — R911 Solitary pulmonary nodule: Secondary | ICD-10-CM | POA: Diagnosis not present

## 2021-03-07 DIAGNOSIS — Z87891 Personal history of nicotine dependence: Secondary | ICD-10-CM | POA: Diagnosis not present

## 2021-03-07 DIAGNOSIS — R5383 Other fatigue: Secondary | ICD-10-CM

## 2021-03-07 LAB — RESP PANEL BY RT-PCR (FLU A&B, COVID) ARPGX2
Influenza A by PCR: NEGATIVE
Influenza B by PCR: NEGATIVE
SARS Coronavirus 2 by RT PCR: NEGATIVE

## 2021-03-07 MED ORDER — BENZONATATE 200 MG PO CAPS: 200.0000 mg | ORAL_CAPSULE | Freq: Three times a day (TID) | ORAL | 0 refills | Status: DC | PRN

## 2021-03-07 MED ORDER — CEFDINIR 300 MG PO CAPS: 300.0000 mg | ORAL_CAPSULE | Freq: Two times a day (BID) | ORAL | 0 refills | Status: DC

## 2021-03-07 MED ORDER — ALBUTEROL SULFATE HFA 108 (90 BASE) MCG/ACT IN AERS: 2.0000 | INHALATION_SPRAY | RESPIRATORY_TRACT | 0 refills | Status: DC | PRN

## 2021-03-07 NOTE — Discharge Instructions (Addendum)
Here is the report of your chest xray. Please make sure to follow up with your primary care doctor to have a repeated chest xray in 6-8 weeks.  EXAM: CHEST - 2 VIEW   COMPARISON:  None.   FINDINGS: There is a normal heart size and mediastinal contours. Lungs are hyperinflated. No pleural effusion or interstitial edema. Nodular opacity within the right middle lobe is identified on the lateral projection image measuring 3 x 2.0 cm. The left lung appears clear. Visualized osseous structures are unremarkable.   IMPRESSION: Nodular opacity within the right middle lobe is identified on the lateral projection image measuring 3 x 2.0 cm. In the acute setting this may represent a small area of pneumonia and follow-up imaging is recommended to ensure complete resolution. In the absence of signs or symptoms of infection further evaluation with CT of the chest is recommended to exclude underlying malignancy.

## 2021-03-07 NOTE — ED Provider Notes (Addendum)
[facility]    CSN: [id number] Arrival date & time: 03/07/21  1445      History   Chief Complaint Chief Complaint  Patient presents with   Cough   Nasal Congestion    HPI Jessica Monroe is a 75 y.o. female with cough x 2-3 days and having cough attacks and productive cough x 2 days. Had neg covid test yesterday and today. Used to be a smoker and quit many years ago.  Had had nose congestion and rhinitis x 1 month.  Has been fatigued more than in the past month and appetite decreased in the past 2 days.  Her anterior ribs are very tender.   Past Medical History:  Diagnosis Date   Anxiety    Cancer ([place])    skin ca   Complication of anesthesia    rash after receiving IV med at oral surgeon's office over 25 yrs ago.  Not sure of med.    Patient Active Problem List   Diagnosis Date Noted   Mild hyperlipidemia 03/25/2019   Underweight 03/25/2019   Biceps tendonitis on right 02/03/2019   Vegetarian diet 02/03/2019   Osteoporosis 04/13/2015   IBS (irritable bowel syndrome) 03/10/2015   Vitamin D deficiency 03/10/2015   Menopause syndrome 03/10/2015   Hx of nonmelanoma skin cancer 12/15/2014    Past Surgical History:  Procedure Laterality Date   DILATION AND CURETTAGE OF UTERUS     TUBAL LIGATION      OB History   No obstetric history on file.      Home Medications    Prior to Admission medications   Medication Sig Start Date End Date Taking? Authorizing Provider  albuterol (VENTOLIN HFA) 108 (90 Base) MCG/ACT inhaler Inhale 2 puffs into the lungs every 4 (four) hours as needed for wheezing or shortness of breath (and cough attacks). 03/07/21  Yes Rodriguez-Southworth, Sunday Spillers, PA-C  Ascorbic Acid (VITAMIN C PO) Take by mouth.   Yes [provider]  benzonatate (TESSALON) 200 MG capsule Take 1 capsule (200 mg total) by mouth 3 (three) times daily as needed. 03/07/21  Yes Rodriguez-Southworth, Sunday Spillers, PA-C  calcium-vitamin D (OSCAL WITH D)  500-200 MG-UNIT tablet Take 1 tablet by mouth.   Yes [provider]  cefdinir (OMNICEF) 300 MG capsule Take 1 capsule (300 mg total) by mouth 2 (two) times daily. 03/07/21  Yes Rodriguez-Southworth, Sunday Spillers, PA-C  Multiple Vitamin (MULTI-VITAMINS) TABS Take 1 tablet by mouth daily.   Yes [provider]  baclofen (LIORESAL) 10 MG tablet Take 1 tablet (10 mg total) by mouth at bedtime as needed for muscle spasms. 05/05/20   Glean Hess, MD    Family History Family History  Problem Relation Age of Onset   Ovarian cancer Mother 27   Bipolar disorder Father     Social History Social History   Tobacco Use   Smoking status: Former    Types: Cigarettes    Quit date: 2012    Years since quitting: 10.9   Smokeless tobacco: Never  Vaping Use   Vaping Use: Never used  Substance Use Topics   Alcohol use: Yes    Alcohol/week: 1.0 standard drink    Types: 1 Shots of liquor per week   Drug use: No     Allergies   Prednisone, Sulfacetamide sodium, Sulfasalazine, Sulfa antibiotics, Tropicamide, and Latex   Review of Systems Review of Systems  Constitutional:  Positive for activity change, appetite change, chills, diaphoresis and fatigue. Negative for fever.  HENT:  Positive for congestion, postnasal drip and rhinorrhea. Negative for ear discharge, ear pain, sore throat and trouble swallowing.   Eyes:  Negative for discharge.  Respiratory:  Positive for cough and shortness of breath. Negative for chest tightness.   Cardiovascular:  Negative for chest pain.  Gastrointestinal:  Negative for abdominal pain.  Musculoskeletal:  Negative for myalgias.  Skin:  Negative for rash.  Neurological:  Negative for headaches.  Hematological:  Negative for adenopathy.    Physical Exam Triage Vital Signs ED Triage Vitals [03/07/21 1510]  Enc Vitals Group     BP (!) 106/51     Pulse Rate 88     Resp 18     Temp 98.1 F (36.7 C)     Temp Source Oral     SpO2 97 %      Weight 122 lb (55.3 kg)     Height      Head Circumference      Peak Flow      Pain Score 0     Pain Loc      Pain Edu?      Excl. in [place]?    No data found.  Updated Vital Signs BP (!) 106/51 (BP Location: Right Arm)    Pulse 88    Temp 98.1 F (36.7 C) (Oral)    Resp 18    Wt 122 lb (55.3 kg)    SpO2 97%    BMI 17.02 kg/m   Visual Acuity Right Eye Distance:   Left Eye Distance:   Bilateral Distance:    Right Eye Near:   Left Eye Near:    Bilateral Near:       Physical Exam Vitals signs and nursing note reviewed.  Constitutional:      General: She is very slim but  in acute distress.    Appearance: Normal appearance. She is  ill-appearing, but not toxic-appearing or diaphoretic.  HENT:     Head: Normocephalic.     Right Ear: Tympanic membrane, ear canal and external ear normal.     Left Ear: Tympanic membrane, ear canal and external ear normal.     Nose: Nose normal.     Mouth/Throat: clear    Mouth: Mucous membranes are moist.  Eyes:     General: No scleral icterus.       Right eye: No discharge.        Left eye: No discharge.     Conjunctiva/sclera: Conjunctivae normal.  Neck:     Musculoskeletal: Neck supple. No neck rigidity.  Cardiovascular:     Rate and Rhythm: Normal rate and regular rhythm.     Heart sounds: No murmur.  Pulmonary:     Effort: Pulmonary effort is decreased since deep breaths provoke rib pain.      Breath sounds: Normal breath sounds. Her anterior lower ribs are tender but there is no crepitations.  Abdominal:     General: Bowel sounds are normal. There is no distension.     Palpations: Abdomen is soft. There is no mass.     Tenderness: There is no abdominal tenderness. There is no guarding or rebound.     Hernia: No hernia is present.  Musculoskeletal: Normal range of motion.  Lymphadenopathy:     Cervical: No cervical adenopathy.  Skin:    General: Skin is warm and dry.     Coloration: Skin is not jaundiced.     Findings: No rash.   Neurological:  Mental Status: She is alert and oriented to person, place, and time.     Gait: Gait normal.  Psychiatric:        Mood and Affect: Mood normal.        Behavior: Behavior normal.        Thought Content: Thought content normal.        Judgment: Judgment normal.   UC Treatments / Results  Labs (all labs ordered are listed, but only abnormal results are displayed) Labs Reviewed  RESP PANEL BY RT-PCR (FLU A&B, COVID) [id number]  Respiratory panel is neg   EKG   Radiology DG Chest 2 View  Result Date: 03/07/2021 CLINICAL DATA:  Cough and congestion.  Fatigue. EXAM: CHEST - 2 VIEW COMPARISON:  None. FINDINGS: There is a normal heart size and mediastinal contours. Lungs are hyperinflated. No pleural effusion or interstitial edema. Nodular opacity within the right middle lobe is identified on the lateral projection image measuring 3 x 2.0 cm. The left lung appears clear. Visualized osseous structures are unremarkable. IMPRESSION: Nodular opacity within the right middle lobe is identified on the lateral projection image measuring 3 x 2.0 cm. In the acute setting this may represent a small area of pneumonia and follow-up imaging is recommended to ensure complete resolution. In the absence of signs or symptoms of infection further evaluation with CT of the chest is recommended to exclude underlying malignancy. These results will be called to the ordering clinician or representative by the Radiologist Assistant, and communication documented in the PACS or [employer]. Electronically Signed   By: Kerby Moors M.D.   On: 03/07/2021 16:35    Procedures Procedures (including critical care time)  Medications Ordered in UC Medications - No data to display  Initial Impression / Assessment and Plan / UC Course  I have reviewed the triage vital signs and the nursing notes. Pertinent labs & imaging results that were available during my care of the patient were reviewed by me and  considered in my medical decision making (see chart for details). R middle lung nodule and possible pneumonia I placed her on Cefdinier, Tessalon, Albuterol inhaler as noted.  Needs to Fu with PCP for repeated XCR in 6-8 weeks. See instructions.     Final Clinical Impressions(s) / UC Diagnoses   Final diagnoses:  Acute cough  Solitary pulmonary nodule     Discharge Instructions      Here is the report of your chest xray. Please make sure to follow up with your primary care doctor to have a repeated chest xray in 6-8 weeks.  EXAM: CHEST - 2 VIEW   COMPARISON:  None.   FINDINGS: There is a normal heart size and mediastinal contours. Lungs are hyperinflated. No pleural effusion or interstitial edema. Nodular opacity within the right middle lobe is identified on the lateral projection image measuring 3 x 2.0 cm. The left lung appears clear. Visualized osseous structures are unremarkable.   IMPRESSION: Nodular opacity within the right middle lobe is identified on the lateral projection image measuring 3 x 2.0 cm. In the acute setting this may represent a small area of pneumonia and follow-up imaging is recommended to ensure complete resolution. In the absence of signs or symptoms of infection further evaluation with CT of the chest is recommended to exclude underlying malignancy.       ED Prescriptions     Medication Sig Dispense Auth. Provider   cefdinir (OMNICEF) 300 MG capsule Take 1 capsule (300 mg total) by mouth  2 (two) times daily. 20 capsule Rodriguez-Southworth, Carmel Garfield, PA-C   benzonatate (TESSALON) 200 MG capsule Take 1 capsule (200 mg total) by mouth 3 (three) times daily as needed. 30 capsule Rodriguez-Southworth, Sunday Spillers, PA-C   albuterol (VENTOLIN HFA) 108 (90 Base) MCG/ACT inhaler Inhale 2 puffs into the lungs every 4 (four) hours as needed for wheezing or shortness of breath (and cough attacks). 18 g Rodriguez-Southworth, Sunday Spillers, PA-C      PDMP not  reviewed this encounter.   Shelby Mattocks, PA-C 03/07/21 1726    Rodriguez-Southworth, [place], PA-C 03/07/21 1727

## 2021-03-07 NOTE — ED Triage Notes (Signed)
Patient is here today for "Cough & Congestion". Started with "Gross fatigue" from [place] to [place]. Lifting and moving things. Runny nose for about a month. Cough now. No sob. No fever. History of bronchitis. @ home COVID19 testing "Negative". COVID19 vaccines "x5, done". Flu vaccine "done".

## 2021-03-29 ENCOUNTER — Encounter: Payer: Self-pay | Admitting: Internal Medicine

## 2021-03-29 ENCOUNTER — Ambulatory Visit: Payer: Medicare PPO | Admitting: Internal Medicine

## 2021-03-29 ENCOUNTER — Other Ambulatory Visit: Payer: Self-pay

## 2021-03-29 VITALS — BP 132/60 | HR 84 | Ht 71.0 in | Wt 116.8 lb

## 2021-03-29 DIAGNOSIS — R918 Other nonspecific abnormal finding of lung field: Secondary | ICD-10-CM

## 2021-03-29 DIAGNOSIS — M5442 Lumbago with sciatica, left side: Secondary | ICD-10-CM

## 2021-03-29 MED ORDER — BACLOFEN 10 MG PO TABS: 10.0000 mg | ORAL_TABLET | Freq: Every evening | ORAL | 5 refills | Status: AC | PRN

## 2021-03-29 NOTE — Progress Notes (Signed)
Date:  11/15/9369   Name:  Jessica Monroe   DOB:  December 06, 1945   MRN:  [id number]   Chief Complaint: Follow-up  HPI Patient presented to [facility] 03/07/21 with a productive cough.  She was treated with Cefdinir, Tessalon and Albuterol.  CXR was also done which showed a RML nodule. She is feeling much better.  No more cough or wheezing.   Appetite is improving.  She is feeling down mainly due to recent PNA and missing her wedding date.  CHEST - 2 VIEW COMPARISON:  None.   FINDINGS: There is a normal heart size and mediastinal contours. Lungs are hyperinflated. No pleural effusion or interstitial edema. Nodular opacity within the right middle lobe is identified on the lateral projection image measuring 3 x 2.0 cm. The left lung appears clear. Visualized osseous structures are unremarkable.   IMPRESSION: Nodular opacity within the right middle lobe is identified on the lateral projection image measuring 3 x 2.0 cm. In the acute setting this may represent a small area of pneumonia and follow-up imaging is recommended to ensure complete resolution. In the absence of signs or symptoms of infection further evaluation with CT of the chest is recommended to exclude underlying malignancy.   Lab Results  Component Value Date   NA 142 05/05/2020   K 4.0 05/05/2020   CO2 23 05/05/2020   GLUCOSE 101 (H) 05/05/2020   BUN 11 05/05/2020   CREATININE 0.78 05/05/2020   CALCIUM 9.4 05/05/2020   GFRNONAA 75 05/05/2020   Lab Results  Component Value Date   CHOL 184 05/05/2020   HDL 69 05/05/2020   LDLCALC 106 (H) 05/05/2020   TRIG 45 05/05/2020   CHOLHDL 2.7 05/05/2020   Lab Results  Component Value Date   TSH 3.300 05/05/2020   No results found for: HGBA1C Lab Results  Component Value Date   WBC 3.5 05/05/2020   HGB 14.1 05/05/2020   HCT 40.6 05/05/2020   MCV 91 05/05/2020   PLT 232 05/05/2020   Lab Results  Component Value Date   ALT 11 05/05/2020   AST 47 (H) 05/05/2020    ALKPHOS 69 05/05/2020   BILITOT 0.3 05/05/2020   Lab Results  Component Value Date   VD25OH 37.4 05/05/2020     Review of Systems  Constitutional:  Negative for chills, fatigue and fever.  Respiratory:  Negative for chest tightness, shortness of breath and wheezing.   Cardiovascular:  Negative for chest pain and leg swelling.  Neurological:  Negative for dizziness, light-headedness and headaches.  Psychiatric/Behavioral:  Positive for dysphoric mood. Negative for sleep disturbance. The patient is nervous/anxious.    Patient Active Problem List   Diagnosis Date Noted   Mild hyperlipidemia 03/25/2019   Underweight 03/25/2019   Biceps tendonitis on right 02/03/2019   Vegetarian diet 02/03/2019   Osteoporosis 04/13/2015   IBS (irritable bowel syndrome) 03/10/2015   Vitamin D deficiency 03/10/2015   Menopause syndrome 03/10/2015   Hx of nonmelanoma skin cancer 12/15/2014    Allergies  Allergen Reactions   Prednisone     Hallucinations    Sulfacetamide Sodium Hives   Sulfasalazine Hives   Sulfa Antibiotics Hives   Tropicamide Other (See Comments)    Dilation lasts 4 days and she feels "terrible"  (drops used to dilate eyes)   Latex Rash    Gloves, condoms    Past Surgical History:  Procedure Laterality Date   DILATION AND CURETTAGE OF UTERUS     TUBAL LIGATION  Social History   Tobacco Use   Smoking status: Former    Types: Cigarettes    Quit date: 2012    Years since quitting: 11.0   Smokeless tobacco: Never  Vaping Use   Vaping Use: Never used  Substance Use Topics   Alcohol use: Yes    Alcohol/week: 1.0 standard drink    Types: 1 Shots of liquor per week   Drug use: No     Medication list has been reviewed and updated.  Current Meds  Medication Sig   albuterol (VENTOLIN HFA) 108 (90 Base) MCG/ACT inhaler Inhale 2 puffs into the lungs every 4 (four) hours as needed for wheezing or shortness of breath (and cough attacks).   Ascorbic Acid (VITAMIN  C PO) Take by mouth.   baclofen (LIORESAL) 10 MG tablet Take 1 tablet (10 mg total) by mouth at bedtime as needed for muscle spasms.   calcium-vitamin D (OSCAL WITH D) 500-200 MG-UNIT tablet Take 1 tablet by mouth.   Multiple Vitamin (MULTI-VITAMINS) TABS Take 1 tablet by mouth daily.    PHQ 2/9 Scores 03/29/2021 02/07/2021 05/05/2020 02/04/2020  PHQ - 2 Score 4 2 0 0  PHQ- 9 Score 15 8 0 -    GAD 7 : Generalized Anxiety Score 03/29/2021 05/05/2020 08/20/2019  Nervous, Anxious, on Edge 2 1 3   Control/stop worrying 2 1 2   Worry too much - different things 2 1 2   Trouble relaxing 3 0 0  Restless 3 0 0  Easily annoyed or irritable 2 0 0  Afraid - awful might happen 0 0 1  Total GAD 7 Score 14 3 8   Anxiety Difficulty Somewhat difficult - Not difficult at all    BP Readings from Last 3 Encounters:  03/29/21 132/60  03/07/21 (!) 106/51  05/05/20 128/72    Physical Exam Constitutional:      General: She is not in acute distress.    Appearance: She is not ill-appearing.  Neck:     Vascular: No carotid bruit.  Cardiovascular:     Rate and Rhythm: Normal rate and regular rhythm.     Pulses: Normal pulses.     Heart sounds: No murmur heard. Pulmonary:     Effort: Pulmonary effort is normal.     Breath sounds: Normal breath sounds. No wheezing or rhonchi.  Musculoskeletal:     Cervical back: Normal range of motion.     Right lower leg: No edema.     Left lower leg: No edema.  Lymphadenopathy:     Cervical: No cervical adenopathy.  Skin:    General: Skin is warm and dry.  Neurological:     Mental Status: She is alert.    Wt Readings from Last 3 Encounters:  03/29/21 116 lb 12.8 oz (53 kg)  03/07/21 122 lb (55.3 kg)  05/05/20 126 lb (57.2 kg)    BP 132/60    Pulse 84    Ht 5\' 11"  (1.803 m)    Wt 116 lb 12.8 oz (53 kg)    SpO2 97%    BMI 16.29 kg/m   Assessment and Plan: 1. Right middle lobe pulmonary infiltrate Recently treated for pneumonia and much improved. The CXR  appearance is consistent with an infiltrate - malignancy less likely given the clinical setting. Recommend repeat CXR in three weeks.  She will call that week for the order to be placed.  2. Midline low back pain with left-sided sciatica, unspecified chronicity - baclofen (LIORESAL) 10 MG tablet; Take  1 tablet (10 mg total) by mouth at bedtime as needed for muscle spasms.  Dispense: 30 each; Refill: 5   Partially dictated using Editor, commissioning. Any errors are unintentional.  Halina Maidens, MD [facility]  03/29/2021

## 2021-03-29 NOTE — Patient Instructions (Signed)
Call in three weeks for an order to have a repeat CXR.

## 2021-05-05 ENCOUNTER — Other Ambulatory Visit: Payer: Self-pay

## 2021-05-05 ENCOUNTER — Ambulatory Visit
Admission: RE | Admit: 2021-05-05 | Discharge: 2021-05-05 | Disposition: A | Discharge status: Home / Self Care | Payer: Medicare PPO | Attending: Internal Medicine | Admitting: Internal Medicine

## 2021-05-05 ENCOUNTER — Ambulatory Visit
Admission: RE | Admit: 2021-05-05 | Discharge: 2021-05-05 | Disposition: A | Discharge status: Home / Self Care | Payer: Medicare PPO | Source: Ambulatory Visit | Attending: Internal Medicine | Admitting: Internal Medicine

## 2021-05-05 DIAGNOSIS — R918 Other nonspecific abnormal finding of lung field: Secondary | ICD-10-CM

## 2021-05-05 DIAGNOSIS — R059 Cough, unspecified: Secondary | ICD-10-CM | POA: Diagnosis not present

## 2021-05-05 DIAGNOSIS — J189 Pneumonia, unspecified organism: Secondary | ICD-10-CM | POA: Diagnosis not present

## 2021-05-09 NOTE — Progress Notes (Signed)
Called pt left VM to call back. ? ?PEC nurse may give results to patient if they return call to clinic, a CRM has been created. ? ?KP

## 2021-05-10 ENCOUNTER — Other Ambulatory Visit: Payer: Self-pay

## 2021-05-10 ENCOUNTER — Ambulatory Visit (INDEPENDENT_AMBULATORY_CARE_PROVIDER_SITE_OTHER): Payer: Medicare PPO | Admitting: Internal Medicine

## 2021-05-10 ENCOUNTER — Encounter: Payer: Self-pay | Admitting: Internal Medicine

## 2021-05-10 VITALS — BP 128/70 | HR 67 | Ht 71.0 in | Wt 119.0 lb

## 2021-05-10 DIAGNOSIS — Z0000 Encounter for general adult medical examination without abnormal findings: Secondary | ICD-10-CM | POA: Diagnosis not present

## 2021-05-10 DIAGNOSIS — J189 Pneumonia, unspecified organism: Secondary | ICD-10-CM | POA: Diagnosis not present

## 2021-05-10 DIAGNOSIS — Z23 Encounter for immunization: Secondary | ICD-10-CM | POA: Diagnosis not present

## 2021-05-10 DIAGNOSIS — M810 Age-related osteoporosis without current pathological fracture: Secondary | ICD-10-CM

## 2021-05-10 DIAGNOSIS — E785 Hyperlipidemia, unspecified: Secondary | ICD-10-CM | POA: Diagnosis not present

## 2021-05-10 DIAGNOSIS — I700 Atherosclerosis of aorta: Secondary | ICD-10-CM | POA: Diagnosis not present

## 2021-05-10 DIAGNOSIS — R7309 Other abnormal glucose: Secondary | ICD-10-CM | POA: Diagnosis not present

## 2021-05-10 DIAGNOSIS — Z1211 Encounter for screening for malignant neoplasm of colon: Secondary | ICD-10-CM

## 2021-05-10 DIAGNOSIS — Z1231 Encounter for screening mammogram for malignant neoplasm of breast: Secondary | ICD-10-CM

## 2021-05-10 MED ORDER — ATORVASTATIN CALCIUM 10 MG PO TABS: 10.0000 mg | ORAL_TABLET | Freq: Every day | ORAL | 1 refills | Status: AC

## 2021-05-10 NOTE — Progress Notes (Signed)
Date:  10/27/1749   Name:  Jessica Monroe   DOB:  1945-07-08   MRN:  [id number]   Chief Complaint: Annual Exam (Breast exam no pap ) Jessica Monroe is a 76 y.o. female who presents today for her Complete Annual Exam. She feels well. She reports exercising walking everyday. She reports she is sleeping well. Breast complaints none.  Mammogram: 2017 DEXA: 2017 osteoporosis Pap smear: discontinued Colonoscopy: FIT 2019  Immunization History  Administered Date(s) Administered   H1N1 04/10/2008   Hepatitis A, Adult 12/21/2016   Influenza, Seasonal, Injecte, Preservative Fre 12/08/2008, 01/10/2010, 12/23/2010, 12/26/2011, 01/30/2012   PFIZER(Purple Top)SARS-COV-2 Vaccination 05/19/2019, 06/08/2019   Pneumococcal Polysaccharide-23 12/26/2011   Tdap 11/11/2010   Zoster, Live 12/23/2010    HPI  Lab Results  Component Value Date   NA 142 05/05/2020   K 4.0 05/05/2020   CO2 23 05/05/2020   GLUCOSE 101 (H) 05/05/2020   BUN 11 05/05/2020   CREATININE 0.78 05/05/2020   CALCIUM 9.4 05/05/2020   GFRNONAA 75 05/05/2020   Lab Results  Component Value Date   CHOL 184 05/05/2020   HDL 69 05/05/2020   LDLCALC 106 (H) 05/05/2020   TRIG 45 05/05/2020   CHOLHDL 2.7 05/05/2020   Lab Results  Component Value Date   TSH 3.300 05/05/2020   No results found for: HGBA1C Lab Results  Component Value Date   WBC 3.5 05/05/2020   HGB 14.1 05/05/2020   HCT 40.6 05/05/2020   MCV 91 05/05/2020   PLT 232 05/05/2020   Lab Results  Component Value Date   ALT 11 05/05/2020   AST 47 (H) 05/05/2020   ALKPHOS 69 05/05/2020   BILITOT 0.3 05/05/2020   Lab Results  Component Value Date   VD25OH 37.4 05/05/2020     Review of Systems  Constitutional:  Negative for chills, fatigue and fever.  HENT:  Negative for congestion, hearing loss, tinnitus, trouble swallowing and voice change.   Eyes:  Negative for visual disturbance.  Respiratory:  Negative for cough, chest tightness, shortness of  breath and wheezing.   Cardiovascular:  Negative for chest pain, palpitations and leg swelling.  Gastrointestinal:  Negative for abdominal pain, constipation, diarrhea and vomiting.  Endocrine: Negative for polydipsia and polyuria.  Genitourinary:  Negative for dysuria, frequency, genital sores, vaginal bleeding and vaginal discharge.  Musculoskeletal:  Negative for arthralgias, gait problem and joint swelling.  Skin:  Negative for color change and rash.  Neurological:  Negative for dizziness, tremors, light-headedness and headaches.  Hematological:  Negative for adenopathy. Does not bruise/bleed easily.  Psychiatric/Behavioral:  Negative for dysphoric mood and sleep disturbance. The patient is not nervous/anxious.    Patient Active Problem List   Diagnosis Date Noted   Mild hyperlipidemia 03/25/2019   Underweight 03/25/2019   Biceps tendonitis on right 02/03/2019   Vegetarian diet 02/03/2019   Osteoporosis 04/13/2015   IBS (irritable bowel syndrome) 03/10/2015   Vitamin D deficiency 03/10/2015   Menopause syndrome 03/10/2015   Hx of nonmelanoma skin cancer 12/15/2014    Allergies  Allergen Reactions   Prednisone     Hallucinations    Sulfacetamide Sodium Hives   Sulfasalazine Hives   Sulfa Antibiotics Hives   Tropicamide Other (See Comments)    Dilation lasts 4 days and she feels "terrible"  (drops used to dilate eyes)   Latex Rash    Gloves, condoms    Past Surgical History:  Procedure Laterality Date   DILATION AND CURETTAGE OF UTERUS  TUBAL LIGATION      Social History   Tobacco Use   Smoking status: Former    Types: Cigarettes    Quit date: 2012    Years since quitting: 11.1   Smokeless tobacco: Never  Vaping Use   Vaping Use: Never used  Substance Use Topics   Alcohol use: Yes    Alcohol/week: 1.0 standard drink    Types: 1 Shots of liquor per week   Drug use: No     Medication list has been reviewed and updated.  Current Meds  Medication Sig    Ascorbic Acid (VITAMIN C PO) Take by mouth.   baclofen (LIORESAL) 10 MG tablet Take 1 tablet (10 mg total) by mouth at bedtime as needed for muscle spasms.   calcium-vitamin D (OSCAL WITH D) 500-200 MG-UNIT tablet Take 1 tablet by mouth.   Multiple Vitamin (MULTI-VITAMINS) TABS Take 1 tablet by mouth daily.    PHQ 2/9 Scores 03/29/2021 02/07/2021 05/05/2020 02/04/2020  PHQ - 2 Score 4 2 0 0  PHQ- 9 Score 15 8 0 -    GAD 7 : Generalized Anxiety Score 03/29/2021 05/05/2020 08/20/2019  Nervous, Anxious, on Edge 2 1 3   Control/stop worrying 2 1 2   Worry too much - different things 2 1 2   Trouble relaxing 3 0 0  Restless 3 0 0  Easily annoyed or irritable 2 0 0  Afraid - awful might happen 0 0 1  Total GAD 7 Score 14 3 8   Anxiety Difficulty Somewhat difficult - Not difficult at all    BP Readings from Last 3 Encounters:  05/10/21 128/70  03/29/21 132/60  03/07/21 (!) 106/51    Physical Exam Vitals and nursing note reviewed.  Constitutional:      General: She is not in acute distress.    Appearance: She is well-developed.  HENT:     Head: Normocephalic and atraumatic.     Right Ear: Tympanic membrane and ear canal normal.     Left Ear: Tympanic membrane and ear canal normal.     Nose:     Right Sinus: No maxillary sinus tenderness.     Left Sinus: No maxillary sinus tenderness.  Eyes:     General: No scleral icterus.       Right eye: No discharge.        Left eye: No discharge.     Conjunctiva/sclera: Conjunctivae normal.  Neck:     Thyroid: No thyromegaly.     Vascular: No carotid bruit.  Cardiovascular:     Rate and Rhythm: Normal rate and regular rhythm.     Pulses: Normal pulses.     Heart sounds: Normal heart sounds.  Pulmonary:     Effort: Pulmonary effort is normal. No respiratory distress.     Breath sounds: No wheezing.  Chest:  Breasts:    Right: No mass, nipple discharge, skin change or tenderness.     Left: No mass, nipple discharge, skin change or  tenderness.  Abdominal:     General: Bowel sounds are normal.     Palpations: Abdomen is soft.     Tenderness: There is no abdominal tenderness.  Musculoskeletal:     Cervical back: Normal range of motion. No erythema.     Right lower leg: No edema.     Left lower leg: No edema.  Lymphadenopathy:     Cervical: No cervical adenopathy.  Skin:    General: Skin is warm and dry.     Findings:  No rash.  Neurological:     Mental Status: She is alert and oriented to person, place, and time.     Cranial Nerves: No cranial nerve deficit.     Sensory: No sensory deficit.     Deep Tendon Reflexes: Reflexes are normal and symmetric.  Psychiatric:        Attention and Perception: Attention normal.        Mood and Affect: Mood normal.    Wt Readings from Last 3 Encounters:  05/10/21 119 lb (54 kg)  03/29/21 116 lb 12.8 oz (53 kg)  03/07/21 122 lb (55.3 kg)    BP 128/70    Pulse 67    Ht 5\' 11"  (1.803 m)    Wt 119 lb (54 kg)    SpO2 96%    BMI 16.60 kg/m   Assessment and Plan: 1. Annual physical exam Stable exam for age Continue healthy diet and exercise - CBC with Differential/Platelet - Comprehensive metabolic panel - TSH  2. Encounter for screening mammogram for breast cancer Declines screening  3. Colon cancer screening - Fecal occult blood, imunochemical  4. Need for vaccination for pneumococcus - Pneumococcal conjugate vaccine 20-valent  5. Age related osteoporosis, unspecified pathological fracture presence Continue exercise and calcium plus D  6. Mild hyperlipidemia With aortic atherosclerosis - pt agrees to a trial of statin. - Lipid panel - atorvastatin (LIPITOR) 10 MG tablet; Take 1 tablet (10 mg total) by mouth daily.  Dispense: 90 tablet; Refill: 1  7. Elevated glucose level - Hemoglobin A1c  8. Aortic atherosclerosis (HCC) - atorvastatin (LIPITOR) 10 MG tablet; Take 1 tablet (10 mg total) by mouth daily.  Dispense: 90 tablet; Refill: 1  9. Pneumonia of  right middle lobe due to infectious organism Last CXR one week ago was improved, Clinically much better Repeat CXR in 5 weeks   Partially dictated using Editor, commissioning. Any errors are unintentional.  Halina Maidens, MD [facility]  05/10/2021

## 2021-05-10 NOTE — Patient Instructions (Signed)
Call me at the end of March to get your next chest xray here in [place],  Schedule at 6 mo follow up for cholesterol check.

## 2021-05-10 NOTE — Progress Notes (Signed)
Informed pt with labs per Dr. Army Melia. Pt verbalized understanding.  KP

## 2021-05-11 DIAGNOSIS — Z0000 Encounter for general adult medical examination without abnormal findings: Secondary | ICD-10-CM | POA: Diagnosis not present

## 2021-05-11 DIAGNOSIS — R7309 Other abnormal glucose: Secondary | ICD-10-CM | POA: Diagnosis not present

## 2021-05-11 DIAGNOSIS — E785 Hyperlipidemia, unspecified: Secondary | ICD-10-CM | POA: Diagnosis not present

## 2021-05-12 LAB — CBC WITH DIFFERENTIAL/PLATELET
Basophils Absolute: 0.1 10*3/uL (ref 0.0–0.2)
Basos: 1 %
EOS (ABSOLUTE): 0.2 10*3/uL (ref 0.0–0.4)
Eos: 3 %
Hematocrit: 42.7 % (ref 34.0–46.6)
Hemoglobin: 14.3 g/dL (ref 11.1–15.9)
Immature Grans (Abs): 0 10*3/uL (ref 0.0–0.1)
Immature Granulocytes: 0 %
Lymphocytes Absolute: 1 10*3/uL (ref 0.7–3.1)
Lymphs: 18 %
MCH: 30.8 pg (ref 26.6–33.0)
MCHC: 33.5 g/dL (ref 31.5–35.7)
MCV: 92 fL (ref 79–97)
Monocytes Absolute: 0.5 10*3/uL (ref 0.1–0.9)
Monocytes: 9 %
Neutrophils Absolute: 3.7 10*3/uL (ref 1.4–7.0)
Neutrophils: 69 %
Platelets: 257 10*3/uL (ref 150–450)
RBC: 4.65 x10E6/uL (ref 3.77–5.28)
RDW: 12.2 % (ref 11.7–15.4)
WBC: 5.4 10*3/uL (ref 3.4–10.8)

## 2021-05-12 LAB — COMPREHENSIVE METABOLIC PANEL
ALT: 8 IU/L (ref 0–32)
AST: 42 IU/L — ABNORMAL HIGH (ref 0–40)
Albumin/Globulin Ratio: 1.8 (ref 1.2–2.2)
Albumin: 4.8 g/dL — ABNORMAL HIGH (ref 3.7–4.7)
Alkaline Phosphatase: 90 IU/L (ref 44–121)
BUN/Creatinine Ratio: 9 — ABNORMAL LOW (ref 12–28)
BUN: 7 mg/dL — ABNORMAL LOW (ref 8–27)
Bilirubin Total: 0.6 mg/dL (ref 0.0–1.2)
CO2: 28 mmol/L (ref 20–29)
Calcium: 9.7 mg/dL (ref 8.7–10.3)
Chloride: 102 mmol/L (ref 96–106)
Creatinine, Ser: 0.79 mg/dL (ref 0.57–1.00)
Globulin, Total: 2.6 g/dL (ref 1.5–4.5)
Glucose: 88 mg/dL (ref 70–99)
Potassium: 4.4 mmol/L (ref 3.5–5.2)
Sodium: 144 mmol/L (ref 134–144)
Total Protein: 7.4 g/dL (ref 6.0–8.5)
eGFR: 78 mL/min/{1.73_m2} (ref >59)

## 2021-05-12 LAB — LIPID PANEL
Chol/HDL Ratio: 2.4 ratio (ref 0.0–4.4)
Cholesterol, Total: 179 mg/dL (ref 100–199)
HDL: 76 mg/dL (ref >39)
LDL Chol Calc (NIH): 92 mg/dL (ref 0–99)
Triglycerides: 58 mg/dL (ref 0–149)
VLDL Cholesterol Cal: 11 mg/dL (ref 5–40)

## 2021-05-12 LAB — HEMOGLOBIN A1C
Est. average glucose Bld gHb Est-mCnc: 114 mg/dL
Hgb A1c MFr Bld: 5.6 % (ref 4.8–5.6)

## 2021-05-12 LAB — TSH: TSH: 4.46 u[IU]/mL (ref 0.450–4.500)

## 2021-05-19 ENCOUNTER — Telehealth: Payer: Self-pay | Admitting: Internal Medicine

## 2021-05-19 NOTE — Telephone Encounter (Signed)
Spoke to pt let her know she will need to call at the end of March for another chest xray. Pt verbalized understanding. ? ?KP ?

## 2021-05-19 NOTE — Telephone Encounter (Signed)
Pt had her yearly cpe on 2.21.23/ pt wants to know if she was suppose to get another xray within 3 weeks of that appt or 6 weeks/ please advise  ?

## 2021-05-27 DIAGNOSIS — Z1211 Encounter for screening for malignant neoplasm of colon: Secondary | ICD-10-CM | POA: Diagnosis not present

## 2021-05-29 LAB — SPECIMEN STATUS REPORT

## 2021-05-29 LAB — FECAL OCCULT BLOOD, IMMUNOCHEMICAL: Fecal Occult Bld: NEGATIVE

## 2021-06-07 ENCOUNTER — Telehealth: Payer: Self-pay | Admitting: Internal Medicine

## 2021-06-07 NOTE — Telephone Encounter (Signed)
Left detailed msg on mobile phone informing of negative stool test. Repeat yearly. P[facility] may give results if pt returns call. ?

## 2021-06-07 NOTE — Telephone Encounter (Signed)
Copied from [place] [id number]. Topic: General - Other ?>> Jun 07, 2021 12:14 PM Pawlus, Brayton Layman A wrote: ?Reason for CRM: Pt called in to go over her results from her stool sample on 3/10, please advise. ?

## 2021-06-08 ENCOUNTER — Telehealth: Payer: Self-pay | Admitting: Internal Medicine

## 2021-06-08 NOTE — Telephone Encounter (Signed)
Copied from [place] [phone]. Topic: General - Other ?>> Jun 08, 2021  3:50 PM Jessica Monroe wrote: ?Reason for CRM: Pt called to speak with Chassity about her stool test / please advise ?

## 2021-06-08 NOTE — Telephone Encounter (Signed)
Patient informed of normal stool test. ?

## 2021-06-16 ENCOUNTER — Telehealth: Payer: Self-pay | Admitting: Internal Medicine

## 2021-06-16 NOTE — Telephone Encounter (Signed)
Copied from [place] [phone]. Topic: General - Other ?>> Jun 16, 2021 10:59 AM Loma [place] wrote: ?Reason for CRM: Pt was calling in for 2 reasons, Pt wanted to obtain 2 or 3 referrals for laser surgeons from Dr B as not satisfied with recent eye surgeries / declined an appt. Also she wanted to let Dr B know nxt wk going in for her 3rd chest X-ray. FU at [phone] ?

## 2021-06-17 ENCOUNTER — Telehealth: Payer: Self-pay | Admitting: Internal Medicine

## 2021-06-17 ENCOUNTER — Ambulatory Visit
Admission: RE | Admit: 2021-06-17 | Discharge: 2021-06-17 | Disposition: A | Discharge status: Home / Self Care | Payer: Medicare PPO | Source: Ambulatory Visit | Attending: Internal Medicine | Admitting: Internal Medicine

## 2021-06-17 ENCOUNTER — Ambulatory Visit
Admission: RE | Admit: 2021-06-17 | Discharge: 2021-06-17 | Disposition: A | Discharge status: Home / Self Care | Payer: Medicare PPO | Attending: Internal Medicine | Admitting: Internal Medicine

## 2021-06-17 ENCOUNTER — Other Ambulatory Visit: Payer: Self-pay

## 2021-06-17 DIAGNOSIS — J439 Emphysema, unspecified: Secondary | ICD-10-CM | POA: Diagnosis not present

## 2021-06-17 DIAGNOSIS — Z8701 Personal history of pneumonia (recurrent): Secondary | ICD-10-CM | POA: Diagnosis not present

## 2021-06-17 DIAGNOSIS — J189 Pneumonia, unspecified organism: Secondary | ICD-10-CM | POA: Diagnosis not present

## 2021-06-17 DIAGNOSIS — J929 Pleural plaque without asbestos: Secondary | ICD-10-CM | POA: Diagnosis not present

## 2021-06-17 DIAGNOSIS — R918 Other nonspecific abnormal finding of lung field: Secondary | ICD-10-CM | POA: Diagnosis not present

## 2021-06-17 NOTE — Telephone Encounter (Signed)
Patient wanted PCP to know she is headed over to have a chest x ray for pneumonia as per PCP recommendation.  ?

## 2021-06-17 NOTE — Telephone Encounter (Signed)
Noted  KP 

## 2021-07-29 DIAGNOSIS — Z961 Presence of intraocular lens: Secondary | ICD-10-CM | POA: Diagnosis not present

## 2021-08-12 ENCOUNTER — Ambulatory Visit
Admission: RE | Admit: 2021-08-12 | Discharge: 2021-08-12 | Disposition: A | Discharge status: Home / Self Care | Payer: Medicare PPO | Source: Ambulatory Visit | Attending: Internal Medicine | Admitting: Internal Medicine

## 2021-08-12 ENCOUNTER — Ambulatory Visit
Admission: RE | Admit: 2021-08-12 | Discharge: 2021-08-12 | Disposition: A | Discharge status: Home / Self Care | Payer: Medicare PPO | Attending: Internal Medicine | Admitting: Internal Medicine

## 2021-08-12 ENCOUNTER — Other Ambulatory Visit: Payer: Self-pay

## 2021-08-12 DIAGNOSIS — J189 Pneumonia, unspecified organism: Secondary | ICD-10-CM | POA: Diagnosis not present

## 2021-08-12 DIAGNOSIS — J439 Emphysema, unspecified: Secondary | ICD-10-CM | POA: Diagnosis not present

## 2021-08-17 ENCOUNTER — Telehealth: Payer: Self-pay

## 2021-08-17 DIAGNOSIS — L905 Scar conditions and fibrosis of skin: Secondary | ICD-10-CM | POA: Diagnosis not present

## 2021-08-17 DIAGNOSIS — Z85828 Personal history of other malignant neoplasm of skin: Secondary | ICD-10-CM | POA: Diagnosis not present

## 2021-08-17 DIAGNOSIS — L570 Actinic keratosis: Secondary | ICD-10-CM | POA: Diagnosis not present

## 2021-08-17 NOTE — Telephone Encounter (Signed)
RN from [facility] called to give patient her recent results of her chest Xray. "Patient declines CT. States she does not feel like she has received good care and her voice has not been heard. States she knows how her body responds to penicillin with pneumonia and "She didn't care about my history." States "Tell her I am finding a new doctor."   - [employer]

## 2021-09-28 DIAGNOSIS — Z961 Presence of intraocular lens: Secondary | ICD-10-CM | POA: Diagnosis not present

## 2021-09-28 DIAGNOSIS — H18513 Endothelial corneal dystrophy, bilateral: Secondary | ICD-10-CM | POA: Diagnosis not present

## 2021-09-28 DIAGNOSIS — H26493 Other secondary cataract, bilateral: Secondary | ICD-10-CM | POA: Diagnosis not present

## 2021-10-05 DIAGNOSIS — K08409 Partial loss of teeth, unspecified cause, unspecified class: Secondary | ICD-10-CM | POA: Diagnosis not present

## 2021-11-07 DIAGNOSIS — R9389 Abnormal findings on diagnostic imaging of other specified body structures: Secondary | ICD-10-CM | POA: Diagnosis not present

## 2021-11-07 DIAGNOSIS — R918 Other nonspecific abnormal finding of lung field: Secondary | ICD-10-CM | POA: Diagnosis not present

## 2021-11-07 DIAGNOSIS — D229 Melanocytic nevi, unspecified: Secondary | ICD-10-CM | POA: Diagnosis not present

## 2021-11-07 DIAGNOSIS — Z85828 Personal history of other malignant neoplasm of skin: Secondary | ICD-10-CM | POA: Diagnosis not present

## 2021-11-07 DIAGNOSIS — L578 Other skin changes due to chronic exposure to nonionizing radiation: Secondary | ICD-10-CM | POA: Diagnosis not present

## 2021-11-07 DIAGNOSIS — L814 Other melanin hyperpigmentation: Secondary | ICD-10-CM | POA: Diagnosis not present

## 2021-11-07 DIAGNOSIS — M419 Scoliosis, unspecified: Secondary | ICD-10-CM | POA: Diagnosis not present

## 2021-11-07 DIAGNOSIS — L570 Actinic keratosis: Secondary | ICD-10-CM | POA: Diagnosis not present

## 2021-11-07 DIAGNOSIS — D692 Other nonthrombocytopenic purpura: Secondary | ICD-10-CM | POA: Diagnosis not present

## 2021-11-07 DIAGNOSIS — L821 Other seborrheic keratosis: Secondary | ICD-10-CM | POA: Diagnosis not present

## 2021-12-02 ENCOUNTER — Ambulatory Visit: Payer: Medicare PPO | Admitting: Internal Medicine

## 2021-12-07 DIAGNOSIS — J479 Bronchiectasis, uncomplicated: Secondary | ICD-10-CM | POA: Diagnosis not present

## 2021-12-07 DIAGNOSIS — J9811 Atelectasis: Secondary | ICD-10-CM | POA: Diagnosis not present

## 2021-12-07 DIAGNOSIS — R918 Other nonspecific abnormal finding of lung field: Secondary | ICD-10-CM | POA: Diagnosis not present

## 2022-02-08 DIAGNOSIS — E559 Vitamin D deficiency, unspecified: Secondary | ICD-10-CM | POA: Diagnosis not present

## 2022-02-08 DIAGNOSIS — J479 Bronchiectasis, uncomplicated: Secondary | ICD-10-CM | POA: Diagnosis not present

## 2022-02-08 DIAGNOSIS — Z23 Encounter for immunization: Secondary | ICD-10-CM | POA: Diagnosis not present

## 2022-02-08 DIAGNOSIS — M810 Age-related osteoporosis without current pathological fracture: Secondary | ICD-10-CM | POA: Diagnosis not present

## 2022-02-08 DIAGNOSIS — L905 Scar conditions and fibrosis of skin: Secondary | ICD-10-CM | POA: Diagnosis not present

## 2022-02-08 DIAGNOSIS — Z85828 Personal history of other malignant neoplasm of skin: Secondary | ICD-10-CM | POA: Diagnosis not present

## 2022-02-08 DIAGNOSIS — Z882 Allergy status to sulfonamides status: Secondary | ICD-10-CM | POA: Diagnosis not present

## 2022-02-08 DIAGNOSIS — J432 Centrilobular emphysema: Secondary | ICD-10-CM | POA: Diagnosis not present

## 2022-02-08 DIAGNOSIS — Z87891 Personal history of nicotine dependence: Secondary | ICD-10-CM | POA: Diagnosis not present

## 2022-02-08 DIAGNOSIS — F17200 Nicotine dependence, unspecified, uncomplicated: Secondary | ICD-10-CM | POA: Diagnosis not present

## 2022-02-13 ENCOUNTER — Ambulatory Visit: Payer: Medicare PPO

## 2022-04-13 ENCOUNTER — Other Ambulatory Visit: Payer: Self-pay | Admitting: Internal Medicine

## 2022-04-13 DIAGNOSIS — I700 Atherosclerosis of aorta: Secondary | ICD-10-CM

## 2022-04-13 DIAGNOSIS — E785 Hyperlipidemia, unspecified: Secondary | ICD-10-CM

## 2022-05-08 DIAGNOSIS — L820 Inflamed seborrheic keratosis: Secondary | ICD-10-CM | POA: Diagnosis not present

## 2022-05-08 DIAGNOSIS — L814 Other melanin hyperpigmentation: Secondary | ICD-10-CM | POA: Diagnosis not present

## 2022-05-08 DIAGNOSIS — L570 Actinic keratosis: Secondary | ICD-10-CM | POA: Diagnosis not present

## 2022-05-08 DIAGNOSIS — Z85828 Personal history of other malignant neoplasm of skin: Secondary | ICD-10-CM | POA: Diagnosis not present

## 2022-05-08 DIAGNOSIS — D229 Melanocytic nevi, unspecified: Secondary | ICD-10-CM | POA: Diagnosis not present

## 2022-05-08 DIAGNOSIS — L578 Other skin changes due to chronic exposure to nonionizing radiation: Secondary | ICD-10-CM | POA: Diagnosis not present

## 2022-05-08 DIAGNOSIS — L821 Other seborrheic keratosis: Secondary | ICD-10-CM | POA: Diagnosis not present

## 2022-05-11 DIAGNOSIS — Z1159 Encounter for screening for other viral diseases: Secondary | ICD-10-CM | POA: Diagnosis not present

## 2022-05-11 DIAGNOSIS — Z789 Other specified health status: Secondary | ICD-10-CM | POA: Diagnosis not present

## 2022-05-11 DIAGNOSIS — R7303 Prediabetes: Secondary | ICD-10-CM | POA: Diagnosis not present

## 2022-05-11 DIAGNOSIS — Z1211 Encounter for screening for malignant neoplasm of colon: Secondary | ICD-10-CM | POA: Diagnosis not present

## 2022-05-11 DIAGNOSIS — Z0000 Encounter for general adult medical examination without abnormal findings: Secondary | ICD-10-CM | POA: Diagnosis not present

## 2022-05-11 DIAGNOSIS — K469 Unspecified abdominal hernia without obstruction or gangrene: Secondary | ICD-10-CM | POA: Diagnosis not present

## 2022-05-11 DIAGNOSIS — Z1231 Encounter for screening mammogram for malignant neoplasm of breast: Secondary | ICD-10-CM | POA: Diagnosis not present

## 2022-05-11 DIAGNOSIS — E785 Hyperlipidemia, unspecified: Secondary | ICD-10-CM | POA: Diagnosis not present

## 2022-05-11 DIAGNOSIS — F4321 Adjustment disorder with depressed mood: Secondary | ICD-10-CM | POA: Diagnosis not present

## 2022-05-11 DIAGNOSIS — R1032 Left lower quadrant pain: Secondary | ICD-10-CM | POA: Diagnosis not present

## 2022-05-11 DIAGNOSIS — M810 Age-related osteoporosis without current pathological fracture: Secondary | ICD-10-CM | POA: Diagnosis not present

## 2022-05-11 DIAGNOSIS — Z23 Encounter for immunization: Secondary | ICD-10-CM | POA: Diagnosis not present

## 2022-05-12 ENCOUNTER — Encounter: Payer: Medicare PPO | Admitting: Internal Medicine

## 2022-05-17 DIAGNOSIS — F4323 Adjustment disorder with mixed anxiety and depressed mood: Secondary | ICD-10-CM | POA: Diagnosis not present

## 2022-05-26 DIAGNOSIS — F4323 Adjustment disorder with mixed anxiety and depressed mood: Secondary | ICD-10-CM | POA: Diagnosis not present

## 2022-05-31 DIAGNOSIS — R1032 Left lower quadrant pain: Secondary | ICD-10-CM | POA: Diagnosis not present

## 2022-05-31 DIAGNOSIS — K469 Unspecified abdominal hernia without obstruction or gangrene: Secondary | ICD-10-CM | POA: Diagnosis not present

## 2022-06-07 DIAGNOSIS — Z91040 Latex allergy status: Secondary | ICD-10-CM | POA: Diagnosis not present

## 2022-06-07 DIAGNOSIS — Z882 Allergy status to sulfonamides status: Secondary | ICD-10-CM | POA: Diagnosis not present

## 2022-06-07 DIAGNOSIS — R1032 Left lower quadrant pain: Secondary | ICD-10-CM | POA: Diagnosis not present

## 2022-06-07 DIAGNOSIS — M810 Age-related osteoporosis without current pathological fracture: Secondary | ICD-10-CM | POA: Diagnosis not present

## 2022-06-07 DIAGNOSIS — J479 Bronchiectasis, uncomplicated: Secondary | ICD-10-CM | POA: Diagnosis not present

## 2022-06-07 DIAGNOSIS — Z888 Allergy status to other drugs, medicaments and biological substances status: Secondary | ICD-10-CM | POA: Diagnosis not present

## 2022-06-07 DIAGNOSIS — Z87891 Personal history of nicotine dependence: Secondary | ICD-10-CM | POA: Diagnosis not present

## 2022-06-13 DIAGNOSIS — Z1211 Encounter for screening for malignant neoplasm of colon: Secondary | ICD-10-CM | POA: Diagnosis not present

## 2022-06-19 LAB — COLOGUARD: COLOGUARD: NEGATIVE

## 2022-07-05 DIAGNOSIS — R1904 Left lower quadrant abdominal swelling, mass and lump: Secondary | ICD-10-CM | POA: Diagnosis not present

## 2022-08-25 DIAGNOSIS — R1032 Left lower quadrant pain: Secondary | ICD-10-CM | POA: Diagnosis not present

## 2022-08-25 DIAGNOSIS — R1904 Left lower quadrant abdominal swelling, mass and lump: Secondary | ICD-10-CM | POA: Diagnosis not present

## 2022-10-02 DIAGNOSIS — F4323 Adjustment disorder with mixed anxiety and depressed mood: Secondary | ICD-10-CM | POA: Diagnosis not present

## 2022-11-06 DIAGNOSIS — L821 Other seborrheic keratosis: Secondary | ICD-10-CM | POA: Diagnosis not present

## 2022-11-06 DIAGNOSIS — L300 Nummular dermatitis: Secondary | ICD-10-CM | POA: Diagnosis not present

## 2022-11-06 DIAGNOSIS — Z8589 Personal history of malignant neoplasm of other organs and systems: Secondary | ICD-10-CM | POA: Diagnosis not present

## 2022-11-06 DIAGNOSIS — L578 Other skin changes due to chronic exposure to nonionizing radiation: Secondary | ICD-10-CM | POA: Diagnosis not present

## 2022-11-06 DIAGNOSIS — L814 Other melanin hyperpigmentation: Secondary | ICD-10-CM | POA: Diagnosis not present

## 2022-11-06 DIAGNOSIS — Z85828 Personal history of other malignant neoplasm of skin: Secondary | ICD-10-CM | POA: Diagnosis not present

## 2022-11-06 DIAGNOSIS — L570 Actinic keratosis: Secondary | ICD-10-CM | POA: Diagnosis not present

## 2022-11-06 DIAGNOSIS — D229 Melanocytic nevi, unspecified: Secondary | ICD-10-CM | POA: Diagnosis not present

## 2022-11-09 DIAGNOSIS — R7989 Other specified abnormal findings of blood chemistry: Secondary | ICD-10-CM | POA: Diagnosis not present

## 2022-11-09 DIAGNOSIS — B353 Tinea pedis: Secondary | ICD-10-CM | POA: Diagnosis not present

## 2022-11-09 DIAGNOSIS — F4323 Adjustment disorder with mixed anxiety and depressed mood: Secondary | ICD-10-CM | POA: Diagnosis not present

## 2022-12-14 IMAGING — CR DG CHEST 2V
2 series · 2 of 2 positions shown · non-contrast
Comparison: 03/07/2021

CLINICAL DATA: Cough, recent pneumonia

EXAM:
CHEST - 2 VIEW

[chest pa]
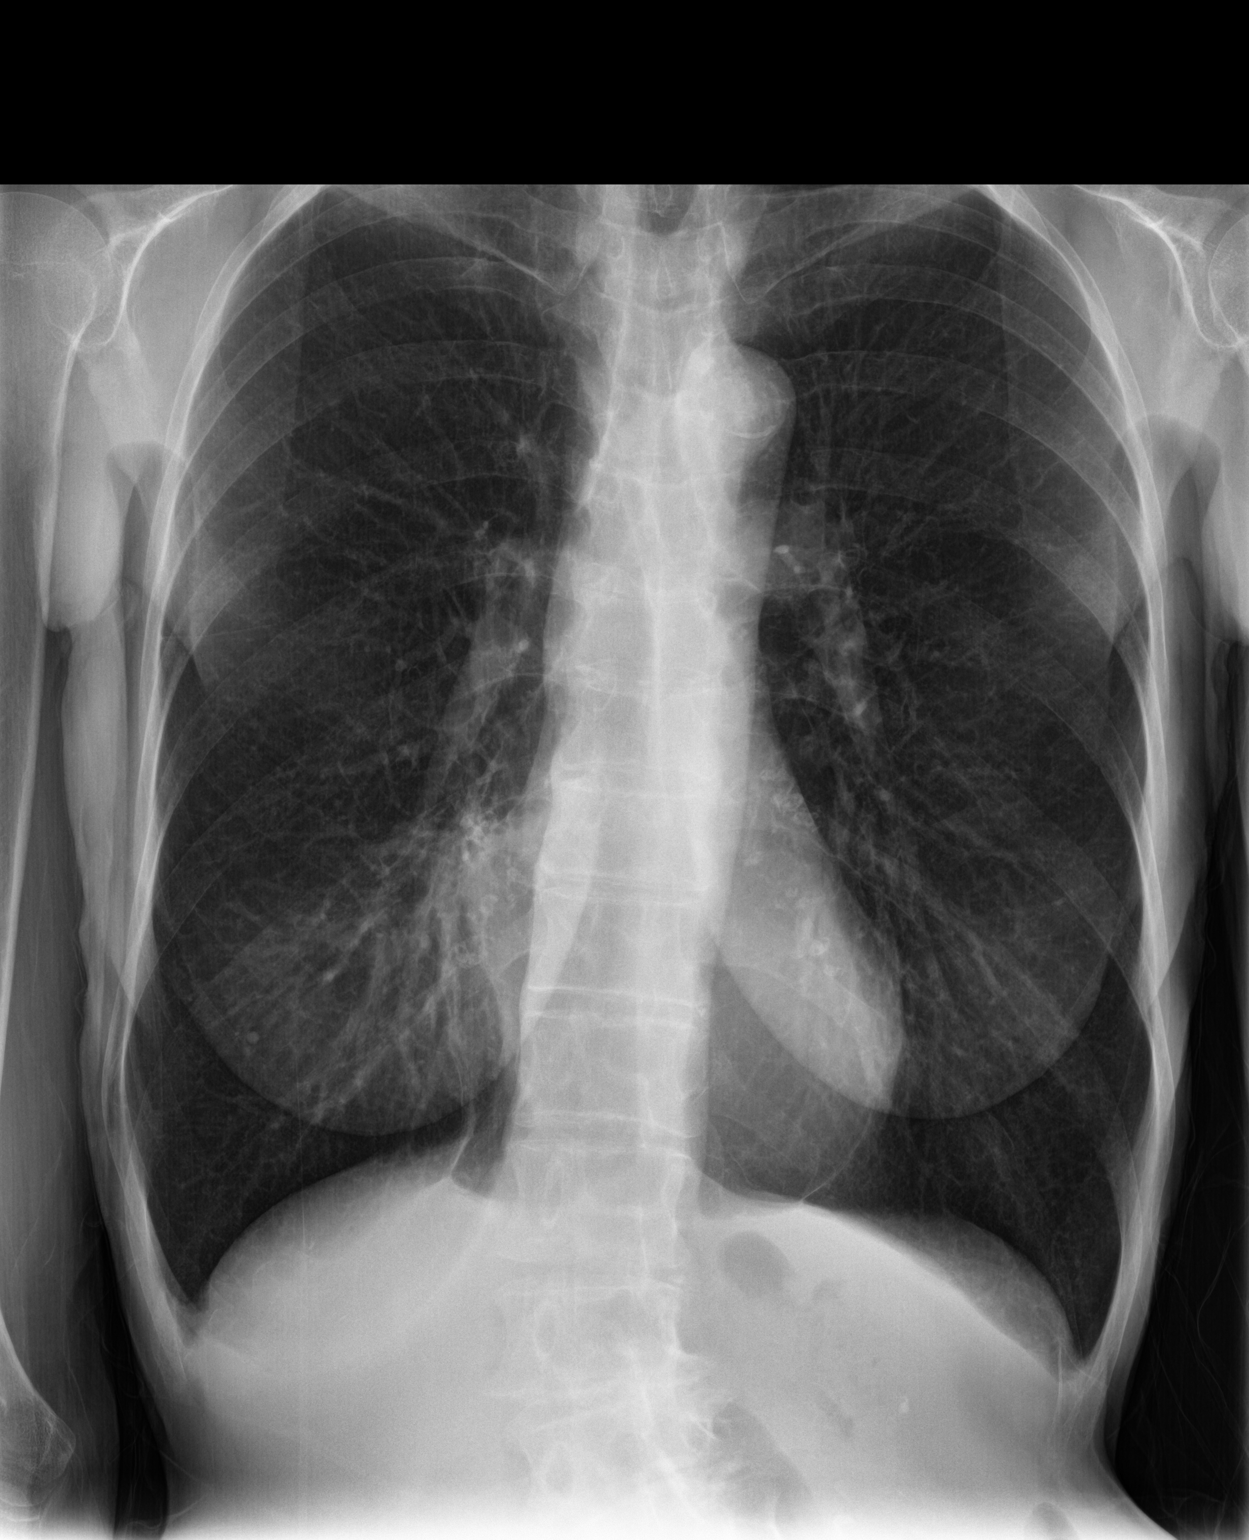

[chest lat]
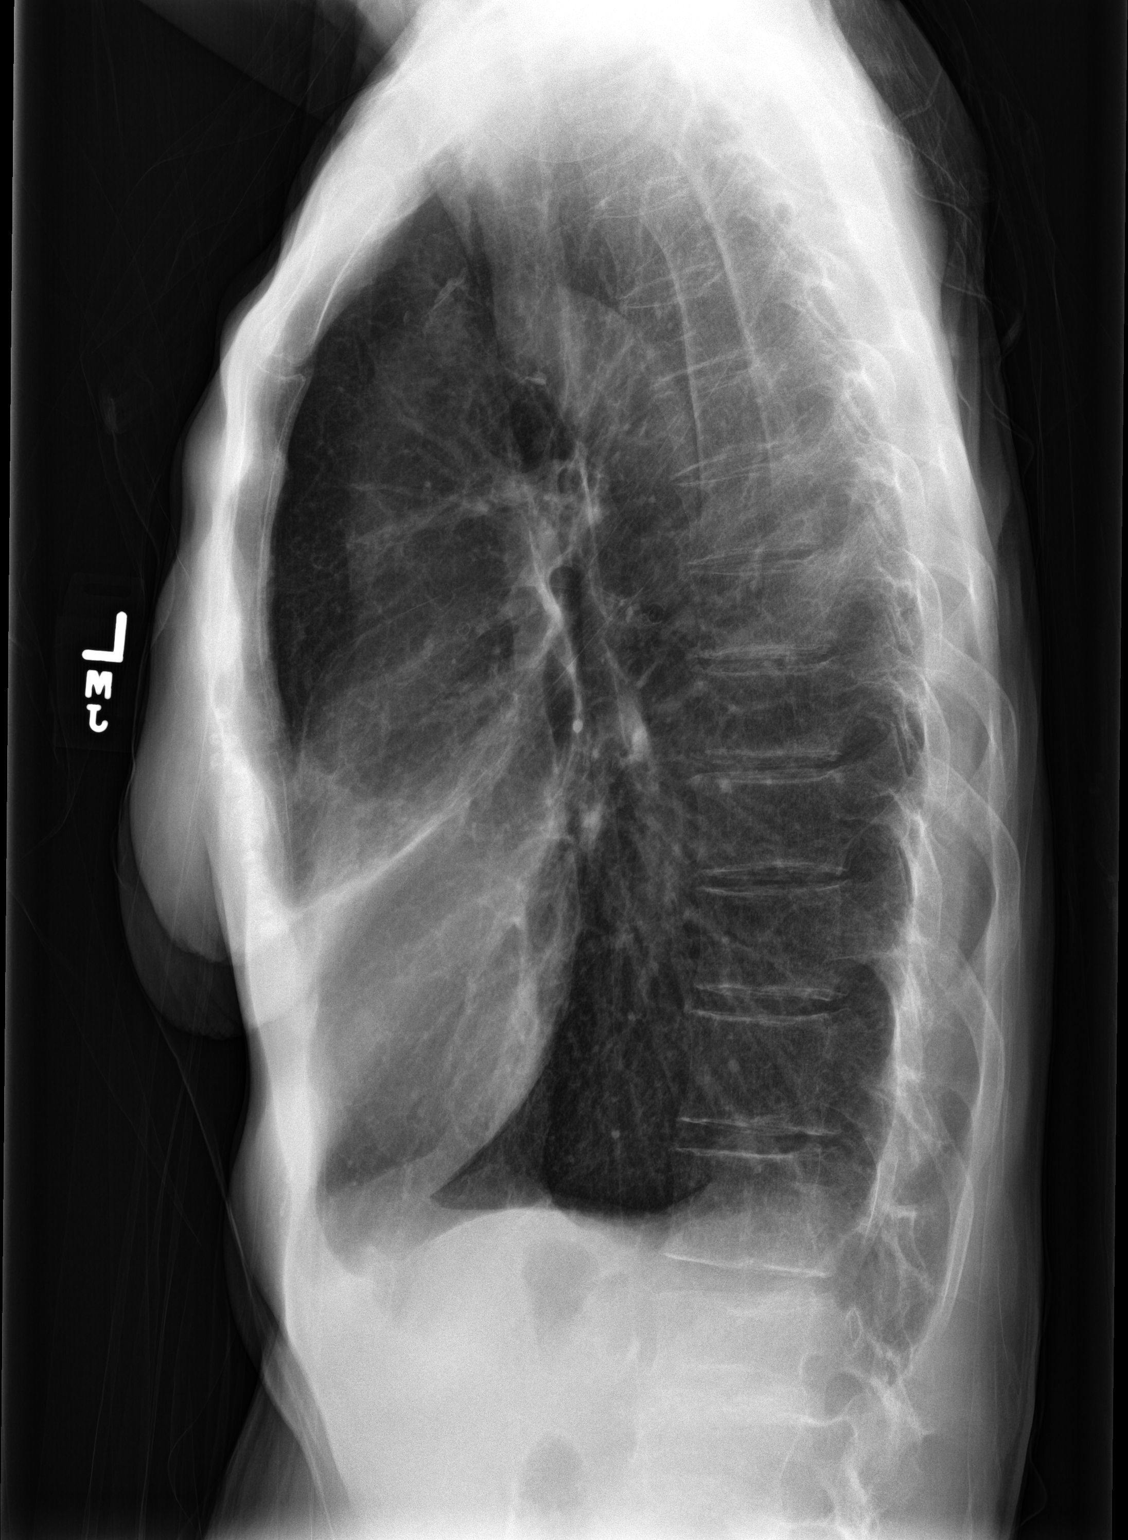

[2 of 2 positions shown; findings below may reference images not displayed]

FINDINGS: Stable hyperinflation suggesting COPD/emphysema. Improvement in the
perihilar right mid lung nodular opacity which is anterior on the
lateral view suggesting resolving focus of pneumonia. No new
airspace process. Negative for edema, effusion, or pneumothorax.
Trachea midline. Normal heart size and vascularity. Aorta
atherosclerotic. Degenerative changes and scoliosis of the lower
spine.
IMPRESSION: Improving perihilar right midlung nodular opacity favored to
represent resolving pneumonia. Recommend continued radiographic
follow-up to document complete resolution.

Stable hyperinflation compatible with COPD/emphysema.

Aorta atherosclerotic
